# Patient Record
Sex: Female | Born: 1940
Health system: Southern US, Community
[De-identification: ages and names within clinical notes are randomized; demographics above are authoritative.]

## PROBLEM LIST (undated history)

## (undated) DIAGNOSIS — M1990 Unspecified osteoarthritis, unspecified site: Secondary | ICD-10-CM

## (undated) DIAGNOSIS — K5790 Diverticulosis of intestine, part unspecified, without perforation or abscess without bleeding: Secondary | ICD-10-CM

## (undated) DIAGNOSIS — Z8719 Personal history of other diseases of the digestive system: Secondary | ICD-10-CM

## (undated) DIAGNOSIS — K8021 Calculus of gallbladder without cholecystitis with obstruction: Secondary | ICD-10-CM

## (undated) DIAGNOSIS — F419 Anxiety disorder, unspecified: Secondary | ICD-10-CM

## (undated) DIAGNOSIS — E039 Hypothyroidism, unspecified: Secondary | ICD-10-CM

## (undated) HISTORY — PX: ABDOMINAL HYSTERECTOMY: SHX81

## (undated) HISTORY — PX: THYROIDECTOMY: SHX17

## (undated) HISTORY — PX: APPENDECTOMY: SHX54

## (undated) HISTORY — PX: COLONOSCOPY: SHX174

## (undated) HISTORY — PX: FRACTURE SURGERY: SHX138

## (undated) HISTORY — DX: Diverticulosis of intestine, part unspecified, without perforation or abscess without bleeding: K57.90

## (undated) HISTORY — PX: DILATION AND CURETTAGE OF UTERUS: SHX78

## (undated) HISTORY — PX: TONSILLECTOMY: SUR1361

---

## 1997-07-10 ENCOUNTER — Encounter: Admission: RE | Admit: 1997-07-10 | Discharge: 1997-10-08 | Payer: Self-pay | Admitting: Neurosurgery

## 1998-11-20 ENCOUNTER — Other Ambulatory Visit: Admission: RE | Admit: 1998-11-20 | Discharge: 1998-11-20 | Payer: Self-pay | Admitting: Obstetrics and Gynecology

## 2003-05-09 ENCOUNTER — Encounter: Admission: RE | Admit: 2003-05-09 | Discharge: 2003-05-09 | Payer: Self-pay | Admitting: Obstetrics and Gynecology

## 2004-06-30 ENCOUNTER — Ambulatory Visit: Payer: Self-pay | Admitting: Gastroenterology

## 2004-07-12 ENCOUNTER — Ambulatory Visit: Payer: Self-pay | Admitting: Gastroenterology

## 2006-04-10 ENCOUNTER — Encounter: Admission: RE | Admit: 2006-04-10 | Discharge: 2006-04-10 | Payer: Self-pay | Admitting: Specialist

## 2007-04-12 HISTORY — PX: PLANTAR FASCIA RELEASE: SHX2239

## 2007-07-12 ENCOUNTER — Other Ambulatory Visit: Admission: RE | Admit: 2007-07-12 | Discharge: 2007-07-12 | Payer: Self-pay | Admitting: Obstetrics and Gynecology

## 2007-12-04 ENCOUNTER — Ambulatory Visit (HOSPITAL_BASED_OUTPATIENT_CLINIC_OR_DEPARTMENT_OTHER): Admission: RE | Admit: 2007-12-04 | Discharge: 2007-12-04 | Payer: Self-pay | Admitting: Orthopedic Surgery

## 2008-12-24 ENCOUNTER — Observation Stay (HOSPITAL_COMMUNITY): Admission: EM | Admit: 2008-12-24 | Discharge: 2008-12-25 | Payer: Self-pay | Admitting: Emergency Medicine

## 2009-07-08 ENCOUNTER — Emergency Department (HOSPITAL_COMMUNITY): Admission: EM | Admit: 2009-07-08 | Discharge: 2009-07-08 | Payer: Self-pay | Admitting: Emergency Medicine

## 2010-07-16 LAB — CK TOTAL AND CKMB (NOT AT ARMC): CK, MB: 1.6 ng/mL (ref 0.3–4.0)

## 2010-07-16 LAB — LIPID PANEL
HDL: 49 mg/dL (ref >39)
Total CHOL/HDL Ratio: 3.8 RATIO
VLDL: 23 mg/dL (ref 0–40)

## 2010-07-16 LAB — COMPREHENSIVE METABOLIC PANEL
ALT: 36 U/L — ABNORMAL HIGH (ref 0–35)
CO2: 28 mEq/L (ref 19–32)
Calcium: 9.6 mg/dL (ref 8.4–10.5)
Chloride: 104 mEq/L (ref 96–112)
Creatinine, Ser: 0.83 mg/dL (ref 0.4–1.2)
GFR calc non Af Amer: >60 mL/min (ref >60)
Glucose, Bld: 90 mg/dL (ref 70–99)
Sodium: 139 mEq/L (ref 135–145)
Total Bilirubin: 1 mg/dL (ref 0.3–1.2)

## 2010-07-16 LAB — CBC
Hemoglobin: 15.1 g/dL — ABNORMAL HIGH (ref 12.0–15.0)
MCHC: 34.3 g/dL (ref 30.0–36.0)
MCV: 92.8 fL (ref 78.0–100.0)
RBC: 4.76 MIL/uL (ref 3.87–5.11)
WBC: 8.8 10*3/uL (ref 4.0–10.5)

## 2010-07-16 LAB — CARDIAC PANEL(CRET KIN+CKTOT+MB+TROPI)
Relative Index: INVALID (ref 0.0–2.5)
Total CK: 85 U/L (ref 7–177)

## 2010-07-16 LAB — TROPONIN I: Troponin I: 0.01 ng/mL (ref 0.00–0.06)

## 2010-07-16 LAB — DIFFERENTIAL
Basophils Absolute: 0.1 10*3/uL (ref 0.0–0.1)
Eosinophils Absolute: 0 10*3/uL (ref 0.0–0.7)
Eosinophils Relative: 0 % (ref 0–5)
Lymphs Abs: 2.1 10*3/uL (ref 0.7–4.0)
Neutrophils Relative %: 71 % (ref 43–77)

## 2010-07-16 LAB — POCT CARDIAC MARKERS
CKMB, poc: <1 ng/mL — ABNORMAL LOW (ref 1.0–8.0)
Myoglobin, poc: 118 ng/mL (ref 12–200)
Troponin i, poc: <0.05 ng/mL (ref 0.00–0.09)
Troponin i, poc: <0.05 ng/mL (ref 0.00–0.09)

## 2010-07-16 LAB — LIPASE, BLOOD: Lipase: 17 U/L (ref 11–59)

## 2010-08-24 NOTE — Op Note (Signed)
Lori Webb, Lori Webb              ACCOUNT NO.:  [id number]   MEDICAL RECORD NO.:  [id number]          PATIENT TYPE:  AMB   LOCATION:  [facility]                         FACILITY:  [facility]   PHYSICIAN:  Marlowe Kays, M.D.  DATE OF BIRTH:  02-09-1941   DATE OF PROCEDURE:  12/04/2007  DATE OF DISCHARGE:                               OPERATIVE REPORT   PREOPERATIVE DIAGNOSIS:  Chronic plantar fasciitis, left heel.   POSTOPERATIVE DIAGNOSIS:  Chronic plantar fasciitis, left heel.   OPERATION:  Release of plantar fascia, left heel.   ASSISTANT:  Nurse.   ANESTHESIA:  General.   PATHOLOGY AND INDICATION FOR PROCEDURE:  Chronic pain and tenderness of  the medial origin of the plantar fascia, left heel.  This has proved  refractory to nonsurgical treatment modalities.  She said it has been  present for about 7 years.  See operative description below for  additional details of her pathology.   PROCEDURE:  Under satisfactory general anesthesia, pneumatic tourniquet  with the left leg Esmarch'd out non-sterilely, and the leg prepped with  DuraPrep from midcalf to toes and draped in a sterile field, time-out  performed.  I made a 2-inch incision parallel to the floor at the  posterior longitudinal arch of the foot and adjacent to os calcis.  The  incision was carried down through the subcutaneous tissue to expose the  plantar fascia.  I protected it superiorly from the overlying muscle and  inferiorly from the soft tissue and then began progressive release  across the width of the heel with small scissors.  The fascia was quite  thick and fibrotic, consistent with her long-term problem.  When she had  had a thorough release as felt by a Therapist, nutritional, I irrigated the  wound well and injected the soft tissues with 0.5% plain Marcaine.  I  reapproximated the subcutaneous tissue with interrupted 3-0 Vicryl and  the skin with interrupted 4-0 nylon mass sutures.  Betadine and Adaptic  dressings  were applied.  Tourniquet was released.  She tolerated the  procedure well and was taken to recovery in satisfactory condition with  no known complications.           ______________________________  Marlowe Kays, M.D.     JA/MEDQ  D:  12/04/2007  T:  12/04/2007  Job:  [id number]

## 2011-08-16 DIAGNOSIS — Z1231 Encounter for screening mammogram for malignant neoplasm of breast: Secondary | ICD-10-CM | POA: Diagnosis not present

## 2011-08-26 DIAGNOSIS — L255 Unspecified contact dermatitis due to plants, except food: Secondary | ICD-10-CM | POA: Diagnosis not present

## 2011-08-26 DIAGNOSIS — E039 Hypothyroidism, unspecified: Secondary | ICD-10-CM | POA: Diagnosis not present

## 2011-11-01 DIAGNOSIS — M799 Soft tissue disorder, unspecified: Secondary | ICD-10-CM | POA: Diagnosis not present

## 2011-11-02 ENCOUNTER — Other Ambulatory Visit: Payer: Self-pay | Admitting: Family Medicine

## 2011-11-07 ENCOUNTER — Ambulatory Visit
Admission: RE | Admit: 2011-11-07 | Discharge: 2011-11-07 | Disposition: A | Discharge status: Home / Self Care | Payer: Medicare Other | Source: Ambulatory Visit | Attending: Family Medicine | Admitting: Family Medicine

## 2011-11-07 DIAGNOSIS — M799 Soft tissue disorder, unspecified: Secondary | ICD-10-CM | POA: Diagnosis not present

## 2011-12-24 DIAGNOSIS — Z23 Encounter for immunization: Secondary | ICD-10-CM | POA: Diagnosis not present

## 2011-12-25 ENCOUNTER — Emergency Department (HOSPITAL_COMMUNITY): Admission: EM | Admit: 2011-12-25 | Payer: Medicare Other | Source: Home / Self Care

## 2012-01-03 ENCOUNTER — Encounter (INDEPENDENT_AMBULATORY_CARE_PROVIDER_SITE_OTHER): Payer: Self-pay | Admitting: General Surgery

## 2012-01-16 ENCOUNTER — Ambulatory Visit (INDEPENDENT_AMBULATORY_CARE_PROVIDER_SITE_OTHER): Payer: Self-pay | Admitting: General Surgery

## 2012-02-01 DIAGNOSIS — L089 Local infection of the skin and subcutaneous tissue, unspecified: Secondary | ICD-10-CM | POA: Diagnosis not present

## 2012-04-17 ENCOUNTER — Encounter (INDEPENDENT_AMBULATORY_CARE_PROVIDER_SITE_OTHER): Payer: Self-pay | Admitting: Surgery

## 2012-04-17 ENCOUNTER — Ambulatory Visit (INDEPENDENT_AMBULATORY_CARE_PROVIDER_SITE_OTHER): Payer: Medicare Other | Admitting: General Surgery

## 2012-04-17 ENCOUNTER — Ambulatory Visit (INDEPENDENT_AMBULATORY_CARE_PROVIDER_SITE_OTHER): Payer: Medicare Other | Admitting: Surgery

## 2012-04-17 VITALS — BP 112/64 | HR 64 | Temp 97.2°F | Resp 16 | Ht 62.75 in | Wt 144.6 lb

## 2012-04-17 DIAGNOSIS — L089 Local infection of the skin and subcutaneous tissue, unspecified: Secondary | ICD-10-CM | POA: Insufficient documentation

## 2012-04-17 DIAGNOSIS — L723 Sebaceous cyst: Secondary | ICD-10-CM | POA: Diagnosis not present

## 2012-04-17 NOTE — Progress Notes (Signed)
Patient ID: Lori Webb, female   DOB: 1940/04/23, 72 y.o.   MRN: [id number]  Chief Complaint  Patient presents with  . Other    seb cyst    HPI Lori Webb is a 72 y.o. female.   HPI This is a 72 year old female who presents with a painful cyst on her right posterior upper back. She reports that it has been present for at least a year but is now being larger and causing increased pain. She has noticed some mild erythema at times but has not had drainage. She has had a previous severely infected sebaceous cyst on her abdominal wall in the past. Past Medical History  Diagnosis Date  . Thyroid disease   . Hyperlipidemia   . Diverticulosis     Past Surgical History  Procedure Date  . Cesarean section     x2  . Thyroidectomy     Family History  Problem Relation Age of Onset  . Cancer Mother     breast  . Cancer Maternal Grandmother     breast    Social History History  Substance Use Topics  . Smoking status: Never Smoker   . Smokeless tobacco: Not on file  . Alcohol Use: 0.6 oz/week    1 Glasses of wine per week     Comment: 1 glass of wine per week    No Known Allergies  Current Outpatient Prescriptions  Medication Sig Dispense Refill  . levothyroxine (SYNTHROID, LEVOTHROID) 100 MCG tablet Take 100 mcg by mouth daily.      . cetirizine (ZYRTEC) 10 MG tablet Take 10 mg by mouth daily.        Review of Systems Review of Systems  All other systems reviewed and are negative.    Blood pressure 112/64, pulse 64, temperature 97.2 F (36.2 C), temperature source Temporal, resp. rate 16, height 5' 2.75" (1.594 m), weight 144 lb 9.6 oz (65.59 kg).  Physical Exam Physical Exam  Constitutional: She is oriented to person, place, and time. She appears well-developed and well-nourished. No distress.  HENT:  Head: Normocephalic and atraumatic.  Right Ear: External ear normal.  Left Ear: External ear normal.  Eyes: Conjunctivae normal are normal. Pupils are  equal, round, and reactive to light.  Neck: Normal range of motion. Neck supple. No tracheal deviation present.  Cardiovascular: Normal rate, regular rhythm, normal heart sounds and intact distal pulses.   No murmur heard. Pulmonary/Chest: Effort normal and breath sounds normal. No respiratory distress.  Abdominal: Soft.  Musculoskeletal: Normal range of motion.  Neurological: She is alert and oriented to person, place, and time.  Skin: Skin is warm and dry. She is not diaphoretic.       There is a 1 cm cyst on the right upper back which is moderately tender and shows very mild erythema.  Psychiatric: Her behavior is normal. Judgment normal.    Data Reviewed   Assessment    Chronic sebaceous cyst Right upper back    Plan    Given her discomfort and her history of previous infected sebaceous cyst, removal of this in the operating room is recommended.  I discussed the risks which includes but is not limited to bleeding, infection, having an open wound, recurrence, etc. She understands and wishes to proceed. Likelihood of success is good       Marrisa Kimber A 04/17/2012, 9:13 AM

## 2012-04-27 ENCOUNTER — Encounter (HOSPITAL_BASED_OUTPATIENT_CLINIC_OR_DEPARTMENT_OTHER): Payer: Self-pay | Admitting: *Deleted

## 2012-05-01 NOTE — H&P (Signed)
Patient ID: Lori Webb, female DOB: 1940/07/26, 72 y.o. MRN: [id number]  Chief Complaint   Patient presents with   .  Other     seb cyst    HPI  Lori Webb is a 72 y.o. female.  HPI  This is a 72 year old female who presents with a painful cyst on her right posterior upper back. She reports that it has been present for at least a year but is now being larger and causing increased pain. She has noticed some mild erythema at times but has not had drainage. She has had a previous severely infected sebaceous cyst on her abdominal wall in the past.  Past Medical History   Diagnosis  Date   .  Thyroid disease    .  Hyperlipidemia    .  Diverticulosis     Past Surgical History   Procedure  Date   .  Cesarean section      x2   .  Thyroidectomy     Family History   Problem  Relation  Age of Onset   .  Cancer  Mother       breast    .  Cancer  Maternal Grandmother       breast    Social History  History   Substance Use Topics   .  Smoking status:  Never Smoker   .  Smokeless tobacco:  Not on file   .  Alcohol Use:  0.6 oz/week     1 Glasses of wine per week      Comment: 1 glass of wine per week    No Known Allergies  Current Outpatient Prescriptions   Medication  Sig  Dispense  Refill   .  levothyroxine (SYNTHROID, LEVOTHROID) 100 MCG tablet  Take 100 mcg by mouth daily.     .  cetirizine (ZYRTEC) 10 MG tablet  Take 10 mg by mouth daily.      Review of Systems  Review of Systems  All other systems reviewed and are negative.   Blood pressure 112/64, pulse 64, temperature 97.2 F (36.2 C), temperature source Temporal, resp. rate 16, height 5' 2.75" (1.594 m), weight 144 lb 9.6 oz (65.59 kg).  Physical Exam  Physical Exam  Constitutional: She is oriented to person, place, and time. She appears well-developed and well-nourished. No distress.  HENT:  Head: Normocephalic and atraumatic.  Right Ear: External ear normal.  Left Ear: External ear normal.  Eyes:  Conjunctivae normal are normal. Pupils are equal, round, and reactive to light.  Neck: Normal range of motion. Neck supple. No tracheal deviation present.  Cardiovascular: Normal rate, regular rhythm, normal heart sounds and intact distal pulses.  No murmur heard.  Pulmonary/Chest: Effort normal and breath sounds normal. No respiratory distress.  Abdominal: Soft.  Musculoskeletal: Normal range of motion.  Neurological: She is alert and oriented to person, place, and time.  Skin: Skin is warm and dry. She is not diaphoretic.  There is a 1 cm cyst on the right upper back which is moderately tender and shows very mild erythema.  Psychiatric: Her behavior is normal. Judgment normal.   Data Reviewed  Assessment   Chronic sebaceous cyst Right upper back   Plan   Given her discomfort and her history of previous infected sebaceous cyst, removal of this in the operating room is recommended. I discussed the risks which includes but is not limited to bleeding, infection, having an open wound, recurrence, etc. She understands and  wishes to proceed. Likelihood of success is good

## 2012-05-02 ENCOUNTER — Encounter (HOSPITAL_BASED_OUTPATIENT_CLINIC_OR_DEPARTMENT_OTHER): Payer: Self-pay | Admitting: *Deleted

## 2012-05-02 ENCOUNTER — Ambulatory Visit (HOSPITAL_BASED_OUTPATIENT_CLINIC_OR_DEPARTMENT_OTHER)
Admission: RE | Admit: 2012-05-02 | Discharge: 2012-05-02 | Disposition: A | Discharge status: Home / Self Care | Payer: Medicare Other | Source: Ambulatory Visit | Attending: Surgery | Admitting: Surgery

## 2012-05-02 ENCOUNTER — Encounter (HOSPITAL_BASED_OUTPATIENT_CLINIC_OR_DEPARTMENT_OTHER)
Admission: RE | Disposition: A | Discharge status: Home / Self Care | Payer: Self-pay | Source: Ambulatory Visit | Attending: Surgery

## 2012-05-02 ENCOUNTER — Ambulatory Visit (HOSPITAL_BASED_OUTPATIENT_CLINIC_OR_DEPARTMENT_OTHER): Payer: Medicare Other | Admitting: *Deleted

## 2012-05-02 DIAGNOSIS — Z79899 Other long term (current) drug therapy: Secondary | ICD-10-CM | POA: Diagnosis not present

## 2012-05-02 DIAGNOSIS — E785 Hyperlipidemia, unspecified: Secondary | ICD-10-CM | POA: Diagnosis not present

## 2012-05-02 DIAGNOSIS — L723 Sebaceous cyst: Secondary | ICD-10-CM | POA: Insufficient documentation

## 2012-05-02 HISTORY — PX: MASS EXCISION: SHX2000

## 2012-05-02 HISTORY — DX: Anxiety disorder, unspecified: F41.9

## 2012-05-02 SURGERY — EXCISION MASS
Anesthesia: Monitor Anesthesia Care | Site: Back | Laterality: Right | Wound class: Clean

## 2012-05-02 MED ORDER — SODIUM CHLORIDE 0.9 % IJ SOLN
3.0000 mL | Freq: Two times a day (BID) | INTRAMUSCULAR | Status: DC
Start: 2012-05-02 — End: 2012-05-02

## 2012-05-02 MED ORDER — MORPHINE SULFATE 2 MG/ML IJ SOLN: 2.0000 mg | INTRAMUSCULAR | Status: DC | PRN

## 2012-05-02 MED ORDER — LIDOCAINE HCL (CARDIAC) 20 MG/ML IV SOLN
INTRAVENOUS | Status: DC | PRN
  Administered 2012-05-02: 20 mg via INTRAVENOUS

## 2012-05-02 MED ORDER — OXYCODONE HCL 5 MG PO TABS: 5.0000 mg | ORAL_TABLET | ORAL | Status: DC | PRN

## 2012-05-02 MED ORDER — MIDAZOLAM HCL 5 MG/5ML IJ SOLN
INTRAMUSCULAR | Status: DC | PRN
  Administered 2012-05-02: 1 mg via INTRAVENOUS

## 2012-05-02 MED ORDER — FENTANYL CITRATE 0.05 MG/ML IJ SOLN
INTRAMUSCULAR | Status: DC | PRN
  Administered 2012-05-02: 25 ug via INTRAVENOUS
  Administered 2012-05-02: 50 ug via INTRAVENOUS
  Administered 2012-05-02: 25 ug via INTRAVENOUS

## 2012-05-02 MED ORDER — ONDANSETRON HCL 4 MG/2ML IJ SOLN
INTRAMUSCULAR | Status: DC | PRN
  Administered 2012-05-02: 4 mg via INTRAVENOUS

## 2012-05-02 MED ORDER — ACETAMINOPHEN 325 MG PO TABS: 650.0000 mg | ORAL_TABLET | ORAL | Status: DC | PRN

## 2012-05-02 MED ORDER — CEFAZOLIN SODIUM-DEXTROSE 2-3 GM-% IV SOLR
2.0000 g | INTRAVENOUS | Status: AC
  Administered 2012-05-02: 2 g via INTRAVENOUS

## 2012-05-02 MED ORDER — LACTATED RINGERS IV SOLN
INTRAVENOUS | Status: DC
  Administered 2012-05-02: 11:00:00 via INTRAVENOUS

## 2012-05-02 MED ORDER — HYDROCODONE-ACETAMINOPHEN 5-325 MG PO TABS: 1.0000 | ORAL_TABLET | ORAL | Status: AC | PRN

## 2012-05-02 MED ORDER — BUPIVACAINE-EPINEPHRINE 0.5% -1:200000 IJ SOLN
INTRAMUSCULAR | Status: DC | PRN
  Administered 2012-05-02: 8 mL

## 2012-05-02 MED ORDER — ONDANSETRON HCL 4 MG/2ML IJ SOLN: 4.0000 mg | Freq: Four times a day (QID) | INTRAMUSCULAR | Status: DC | PRN

## 2012-05-02 MED ORDER — SODIUM CHLORIDE 0.9 % IJ SOLN: 3.0000 mL | INTRAMUSCULAR | Status: DC | PRN

## 2012-05-02 MED ORDER — ACETAMINOPHEN 650 MG RE SUPP: 650.0000 mg | RECTAL | Status: DC | PRN

## 2012-05-02 MED ORDER — SODIUM CHLORIDE 0.9 % IV SOLN: 250.0000 mL | INTRAVENOUS | Status: DC | PRN

## 2012-05-02 MED ORDER — HYDROCODONE-ACETAMINOPHEN 5-325 MG PO TABS: 1.0000 | ORAL_TABLET | ORAL | Status: DC | PRN

## 2012-05-02 MED ORDER — PROPOFOL 10 MG/ML IV EMUL
INTRAVENOUS | Status: DC | PRN
  Administered 2012-05-02: 100 ug/kg/min via INTRAVENOUS

## 2012-05-02 SURGICAL SUPPLY — 41 items
BENZOIN TINCTURE PRP APPL 2/3 (GAUZE/BANDAGES/DRESSINGS) ×2 IMPLANT
BLADE HEX COATED 2.75 (ELECTRODE) IMPLANT
BLADE SURG 15 STRL LF DISP TIS (BLADE) ×1 IMPLANT
BLADE SURG 15 STRL SS (BLADE) ×1
BLADE SURG ROTATE 9660 (MISCELLANEOUS) IMPLANT
CANISTER SUCTION 1200CC (MISCELLANEOUS) IMPLANT
CHLORAPREP W/TINT 26ML (MISCELLANEOUS) ×2 IMPLANT
CLOTH BEACON ORANGE TIMEOUT ST (SAFETY) ×2 IMPLANT
COVER MAYO STAND STRL (DRAPES) ×2 IMPLANT
COVER TABLE BACK 60X90 (DRAPES) ×2 IMPLANT
DECANTER SPIKE VIAL GLASS SM (MISCELLANEOUS) ×2 IMPLANT
DRAPE PED LAPAROTOMY (DRAPES) ×2 IMPLANT
DRAPE UTILITY XL STRL (DRAPES) ×2 IMPLANT
DRSG TEGADERM 4X4.75 (GAUZE/BANDAGES/DRESSINGS) ×2 IMPLANT
ELECT REM PT RETURN 9FT ADLT (ELECTROSURGICAL) ×2
ELECTRODE REM PT RTRN 9FT ADLT (ELECTROSURGICAL) ×1 IMPLANT
GAUZE SPONGE 4X4 12PLY STRL LF (GAUZE/BANDAGES/DRESSINGS) ×2 IMPLANT
GLOVE INDICATOR 7.0 STRL GRN (GLOVE) ×2 IMPLANT
GLOVE SURG SIGNA 7.5 PF LTX (GLOVE) ×2 IMPLANT
GLOVE SURG SS PI 7.0 STRL IVOR (GLOVE) ×2 IMPLANT
GOWN PREVENTION PLUS XLARGE (GOWN DISPOSABLE) ×2 IMPLANT
GOWN PREVENTION PLUS XXLARGE (GOWN DISPOSABLE) ×2 IMPLANT
NEEDLE HYPO 25X1 1.5 SAFETY (NEEDLE) ×2 IMPLANT
NS IRRIG 1000ML POUR BTL (IV SOLUTION) IMPLANT
PACK BASIN DAY SURGERY FS (CUSTOM PROCEDURE TRAY) ×2 IMPLANT
PENCIL BUTTON HOLSTER BLD 10FT (ELECTRODE) ×2 IMPLANT
SLEEVE SCD COMPRESS KNEE MED (MISCELLANEOUS) ×2 IMPLANT
SPONGE LAP 4X18 X RAY DECT (DISPOSABLE) IMPLANT
STRIP CLOSURE SKIN 1/2X4 (GAUZE/BANDAGES/DRESSINGS) ×2 IMPLANT
SUT MNCRL AB 4-0 PS2 18 (SUTURE) ×2 IMPLANT
SUT VIC AB 2-0 SH 27 (SUTURE)
SUT VIC AB 2-0 SH 27XBRD (SUTURE) IMPLANT
SUT VIC AB 3-0 SH 27 (SUTURE) ×1
SUT VIC AB 3-0 SH 27X BRD (SUTURE) ×1 IMPLANT
SYR BULB 3OZ (MISCELLANEOUS) IMPLANT
SYR CONTROL 10ML LL (SYRINGE) ×2 IMPLANT
TOWEL OR 17X24 6PK STRL BLUE (TOWEL DISPOSABLE) ×2 IMPLANT
TOWEL OR NON WOVEN STRL DISP B (DISPOSABLE) ×2 IMPLANT
TUBE CONNECTING 20X1/4 (TUBING) IMPLANT
WATER STERILE IRR 1000ML POUR (IV SOLUTION) ×2 IMPLANT
YANKAUER SUCT BULB TIP NO VENT (SUCTIONS) IMPLANT

## 2012-05-02 NOTE — Anesthesia Preprocedure Evaluation (Addendum)
Anesthesia Evaluation Anesthesia Physical Anesthesia Plan  ASA: II  Anesthesia Plan:    Post-op Pain Management:    Induction:   Airway Management Planned:   Additional Equipment:   Intra-op Plan:   Post-operative Plan:   Informed Consent:   Plan Discussed with:   Anesthesia Plan Comments:         Anesthesia Quick Evaluation  

## 2012-05-02 NOTE — Op Note (Signed)
EXCISION MASS  Procedure Note  Lori Webb 05/02/2012   Pre-op Diagnosis: right upper back skin cyst     Post-op Diagnosis: same  Procedure(s): EXCISION SEBACEOUS CYST RIGHT UPPER BACK   Surgeon(s): Shelly Rubenstein, MD  Anesthesia: Monitor Anesthesia Care  Staff:  Alphonzo Lemmings, RN - Circulator Flor M Neiers, CST - Scrub Person  Estimated Blood Loss: Minimal               Specimens: sent to path          [facility] A   Date: 05/02/2012  Time: 11:51 AM

## 2012-05-02 NOTE — Anesthesia Postprocedure Evaluation (Signed)
Anesthesia Post Note  Patient: Lori Webb  Procedure(s) Performed: Procedure(s) (LRB): EXCISION MASS (Right)  Anesthesia type: general  Patient location: PACU  Post pain: Pain level controlled  Post assessment: Patient's Cardiovascular Status Stable  Last Vitals:  Filed Vitals:   05/02/12 1151  BP: 109/49  Pulse:   Temp: 37 C  Resp: 10    Post vital signs: Reviewed and stable  Level of consciousness: sedated  Complications: No apparent anesthesia complications

## 2012-05-02 NOTE — Anesthesia Procedure Notes (Addendum)
Procedure Name: MAC Date/Time: 05/02/2012 11:26 AM Performed by: Meyer Russel Pre-anesthesia Checklist: Patient identified, Emergency Drugs available, Suction available and Patient being monitored Patient Re-evaluated:Patient Re-evaluated prior to inductionOxygen Delivery Method: Simple face mask Preoxygenation: Pre-oxygenation with 100% oxygen

## 2012-05-02 NOTE — Interval H&P Note (Signed)
History and Physical Interval Note: no change in H and P  05/02/2012 11:10 AM  Lori Webb  has presented today for surgery, with the diagnosis of right upper back skin cyst  The various methods of treatment have been discussed with the patient and family. After consideration of risks, benefits and other options for treatment, the patient has consented to  Procedure(s) (LRB) with comments: EXCISION MASS (Right) - excision cyst right upper back as a surgical intervention .  The patient's history has been reviewed, patient examined, no change in status, stable for surgery.  I have reviewed the patient's chart and labs.  Questions were answered to the patient's satisfaction.     [name] A

## 2012-05-02 NOTE — Transfer of Care (Signed)
Immediate Anesthesia Transfer of Care Note  Patient: Lori Webb  Procedure(s) Performed: Procedure(s) (LRB) with comments: EXCISION MASS (Right) - excision cyst right upper back  Patient Location: PACU  Anesthesia Type:MAC  Level of Consciousness: awake, alert  and oriented  Airway & Oxygen Therapy: Patient Spontanous Breathing, SaO2 100% on RA  Post-op Assessment:  Report given to PACU RN, Post -op Vital signs reviewed and stable and Patient moving all extremities  Post vital signs: Reviewed and stable  Complications: No apparent anesthesia complications

## 2012-05-03 ENCOUNTER — Encounter (HOSPITAL_BASED_OUTPATIENT_CLINIC_OR_DEPARTMENT_OTHER): Payer: Self-pay | Admitting: Surgery

## 2012-05-03 NOTE — Op Note (Signed)
NAMEBENJAMIN, Lori Webb NO.:  [id number]  MEDICAL RECORD NO.:  [id number]  LOCATION:  [facility]                         FACILITY:  [facility]  PHYSICIAN:  Abigail Miyamoto, M.D. DATE OF BIRTH:  07/10/1940  DATE OF PROCEDURE:  05/02/2012 DATE OF DISCHARGE:                              OPERATIVE REPORT   PREOPERATIVE DIAGNOSIS:  Sebaceous cyst, right upper back.  POSTOPERATIVE DIAGNOSIS:  Sebaceous cyst, right upper back.  PROCEDURE:  Excision of sebaceous cyst, right upper back.  SURGEON:  Abigail Miyamoto, M.D.  ANESTHESIA:  0.25% Marcaine and monitored anesthesia care.  ESTIMATED BLOOD LOSS:  Minimal.  INDICATIONS:  This is a 72 year old female, who has had a chronic sebaceous cyst, which had been previously infected on her right upper back.  Because of persistent pain, increase in size, and recurrent infections, decision was made to proceed with removal of the cyst.  PROCEDURE IN DETAIL:  The patient was brought to the operating room, identified as Lori Webb.  She was placed supine on the operating table.  General anesthesia was induced.  The patient was placed in the left lateral decubitus position.  Her right upper back were then prepped and draped in the usual sterile fashion.  I anesthetized the skin overlying the cyst with Marcaine.  I made a small longitudinal incision over the cyst with a scalpel and taken down into the subcutaneous tissue with the cautery.  I then was able to remove the cyst and cyst contents including the entire capsule from the subcutaneous tissue with the electrocautery.  The cyst was sent to pathology for evaluation. Hemostasis was achieved with cautery.  I anesthetized the wound further with Marcaine.  I then closed the subcutaneous tissue with interrupted 3- 0 Vicryl sutures and closed the skin with a running 4-0 Monocryl.  Steri- Strips, gauze, and Tegaderm were then applied.  The patient tolerated the procedure well.  All the  counts were correct at the end of the procedure.  The patient was then taken in a stable condition from the operating room to the recovery room.     Abigail Miyamoto, M.D.     DB/MEDQ  D:  05/02/2012  T:  05/03/2012  Job:  [id number]

## 2012-05-22 ENCOUNTER — Encounter (INDEPENDENT_AMBULATORY_CARE_PROVIDER_SITE_OTHER): Payer: Medicare Other | Admitting: Surgery

## 2012-05-24 ENCOUNTER — Encounter (INDEPENDENT_AMBULATORY_CARE_PROVIDER_SITE_OTHER): Payer: Medicare Other | Admitting: Surgery

## 2012-06-12 DIAGNOSIS — H5200 Hypermetropia, unspecified eye: Secondary | ICD-10-CM | POA: Diagnosis not present

## 2012-06-12 DIAGNOSIS — H52229 Regular astigmatism, unspecified eye: Secondary | ICD-10-CM | POA: Diagnosis not present

## 2012-06-12 DIAGNOSIS — H04129 Dry eye syndrome of unspecified lacrimal gland: Secondary | ICD-10-CM | POA: Diagnosis not present

## 2012-06-12 DIAGNOSIS — H524 Presbyopia: Secondary | ICD-10-CM | POA: Diagnosis not present

## 2012-11-20 DIAGNOSIS — N12 Tubulo-interstitial nephritis, not specified as acute or chronic: Secondary | ICD-10-CM | POA: Diagnosis not present

## 2012-11-20 DIAGNOSIS — E039 Hypothyroidism, unspecified: Secondary | ICD-10-CM | POA: Diagnosis not present

## 2012-11-20 DIAGNOSIS — R319 Hematuria, unspecified: Secondary | ICD-10-CM | POA: Diagnosis not present

## 2012-11-21 ENCOUNTER — Encounter (HOSPITAL_BASED_OUTPATIENT_CLINIC_OR_DEPARTMENT_OTHER): Payer: Self-pay | Admitting: *Deleted

## 2012-11-21 ENCOUNTER — Emergency Department (HOSPITAL_BASED_OUTPATIENT_CLINIC_OR_DEPARTMENT_OTHER): Payer: Medicare Other

## 2012-11-21 ENCOUNTER — Emergency Department (HOSPITAL_BASED_OUTPATIENT_CLINIC_OR_DEPARTMENT_OTHER)
Admission: EM | Admit: 2012-11-21 | Discharge: 2012-11-22 | Disposition: A | Discharge status: Home / Self Care | Payer: Medicare Other | Attending: Emergency Medicine | Admitting: Emergency Medicine

## 2012-11-21 DIAGNOSIS — K5732 Diverticulitis of large intestine without perforation or abscess without bleeding: Secondary | ICD-10-CM | POA: Diagnosis not present

## 2012-11-21 DIAGNOSIS — R509 Fever, unspecified: Secondary | ICD-10-CM | POA: Diagnosis not present

## 2012-11-21 DIAGNOSIS — F411 Generalized anxiety disorder: Secondary | ICD-10-CM | POA: Insufficient documentation

## 2012-11-21 DIAGNOSIS — K5792 Diverticulitis of intestine, part unspecified, without perforation or abscess without bleeding: Secondary | ICD-10-CM

## 2012-11-21 DIAGNOSIS — Z79899 Other long term (current) drug therapy: Secondary | ICD-10-CM | POA: Insufficient documentation

## 2012-11-21 DIAGNOSIS — R110 Nausea: Secondary | ICD-10-CM | POA: Diagnosis not present

## 2012-11-21 DIAGNOSIS — N281 Cyst of kidney, acquired: Secondary | ICD-10-CM | POA: Diagnosis not present

## 2012-11-21 DIAGNOSIS — K8020 Calculus of gallbladder without cholecystitis without obstruction: Secondary | ICD-10-CM | POA: Diagnosis not present

## 2012-11-21 LAB — COMPREHENSIVE METABOLIC PANEL
ALT: 40 U/L — ABNORMAL HIGH (ref 0–35)
AST: 37 U/L (ref 0–37)
Albumin: 3.5 g/dL (ref 3.5–5.2)
Calcium: 9.9 mg/dL (ref 8.4–10.5)
GFR calc Af Amer: 72 mL/min — ABNORMAL LOW (ref >90)
Glucose, Bld: 121 mg/dL — ABNORMAL HIGH (ref 70–99)
Sodium: 140 mEq/L (ref 135–145)
Total Protein: 7 g/dL (ref 6.0–8.3)

## 2012-11-21 LAB — CBC WITH DIFFERENTIAL/PLATELET
Basophils Absolute: 0 10*3/uL (ref 0.0–0.1)
Basophils Relative: 0 % (ref 0–1)
Eosinophils Absolute: 0.2 10*3/uL (ref 0.0–0.7)
Eosinophils Relative: 2 % (ref 0–5)
MCH: 31.4 pg (ref 26.0–34.0)
MCHC: 32.9 g/dL (ref 30.0–36.0)
MCV: 95.5 fL (ref 78.0–100.0)
Platelets: 266 10*3/uL (ref 150–400)
RDW: 12.4 % (ref 11.5–15.5)

## 2012-11-21 LAB — URINALYSIS, ROUTINE W REFLEX MICROSCOPIC
Protein, ur: NEGATIVE mg/dL
Urobilinogen, UA: 1 mg/dL (ref 0.0–1.0)

## 2012-11-21 LAB — URINE MICROSCOPIC-ADD ON

## 2012-11-21 LAB — CG4 I-STAT (LACTIC ACID): Lactic Acid, Venous: 0.72 mmol/L (ref 0.5–2.2)

## 2012-11-21 MED ORDER — IOHEXOL 300 MG/ML  SOLN
100.0000 mL | Freq: Once | INTRAMUSCULAR | Status: AC | PRN
  Administered 2012-11-21: 100 mL via INTRAVENOUS

## 2012-11-21 MED ORDER — ONDANSETRON HCL 4 MG PO TABS: 4.0000 mg | ORAL_TABLET | Freq: Four times a day (QID) | ORAL | Status: DC

## 2012-11-21 MED ORDER — MORPHINE SULFATE 2 MG/ML IJ SOLN
2.0000 mg | Freq: Once | INTRAMUSCULAR | Status: AC
  Administered 2012-11-21: 2 mg via INTRAVENOUS
  Filled 2012-11-21: qty 1

## 2012-11-21 MED ORDER — METRONIDAZOLE 500 MG PO TABS: 500.0000 mg | ORAL_TABLET | Freq: Two times a day (BID) | ORAL | Status: DC

## 2012-11-21 MED ORDER — SODIUM CHLORIDE 0.9 % IV BOLUS (SEPSIS)
1000.0000 mL | Freq: Once | INTRAVENOUS | Status: AC
  Administered 2012-11-21: 1000 mL via INTRAVENOUS

## 2012-11-21 MED ORDER — METRONIDAZOLE IN NACL 5-0.79 MG/ML-% IV SOLN
500.0000 mg | Freq: Once | INTRAVENOUS | Status: AC
  Administered 2012-11-21: 500 mg via INTRAVENOUS
  Filled 2012-11-21: qty 100

## 2012-11-21 MED ORDER — ONDANSETRON HCL 4 MG/2ML IJ SOLN
4.0000 mg | Freq: Once | INTRAMUSCULAR | Status: AC
  Administered 2012-11-21: 4 mg via INTRAVENOUS
  Filled 2012-11-21: qty 2

## 2012-11-21 MED ORDER — CIPROFLOXACIN IN D5W 400 MG/200ML IV SOLN
400.0000 mg | Freq: Once | INTRAVENOUS | Status: AC
  Administered 2012-11-21: 400 mg via INTRAVENOUS
  Filled 2012-11-21: qty 200

## 2012-11-21 NOTE — ED Notes (Signed)
MD at bedside giving test results and discussing plan of care. 

## 2012-11-21 NOTE — ED Provider Notes (Addendum)
CSN: [id number]     Arrival date & time 11/21/12  1843 History  This chart was scribed for Glynn Octave, MD by Kellison Scrape, ED Scribe. This patient was seen in room MH11/MH11 and the patient's care was started at 7:20 PM.   Chief Complaint  Patient presents with  . Flank Pain  . Nausea  . Fever    The history is provided by the patient. No language interpreter was used.    HPI Comments: Lori Webb is a 72 y.o. female who presents to the Emergency Department complaining of 4 to 5 days of gradual onset, gradually worsening, constant right flank pain that radiates over the right hip. She states that she developed HAs, lower abdominal pain and hematuria yesterday. She reports a fever of 101 yesterday but denies any since. She was seen by her PCP and was diagnosed with an infection yestersday. She was given an antibiotic injection in the office and prescribed Cipro. She states that she took ASA with no improvement. She denies urinary frequency, dysuria, vaginal bleeding or discharge, diarrhea. She reports an appendectomy, two c-sections and partial hysterectomy. She denies having a h/o cardiac or pulmonary conditions or DM.   Past Medical History  Diagnosis Date  . Diverticulosis   . Anxiety    Past Surgical History  Procedure Laterality Date  . Cesarean section      x2  . Thyroidectomy    . Tonsillectomy    . Appendectomy    . Abdominal hysterectomy    . Dilation and curettage of uterus    . Plantar fascia release  2009    left heel  . Colonoscopy    . Mass excision  05/02/2012    Procedure: EXCISION MASS;  Surgeon: Shelly Rubenstein, MD;  Location: [facility];  Service: General;  Laterality: Right;  excision cyst right upper back   Family History  Problem Relation Age of Onset  . Cancer Mother     breast  . Cancer Maternal Grandmother     breast   History  Substance Use Topics  . Smoking status: Never Smoker   . Smokeless tobacco: Not on file   . Alcohol Use: 0.6 oz/week    1 Glasses of wine per week     Comment: 1 glass of wine per night   No OB history provided.  Review of Systems  A complete 10 system review of systems was obtained and all systems are negative except as noted in the HPI and PMH.   Allergies  Review of patient's allergies indicates no known allergies.  Home Medications   Current Outpatient Rx  Name  Route  Sig  Dispense  Refill  . ciprofloxacin (CIPRO XR) 500 MG 24 hr tablet   Oral   Take 500 mg by mouth daily.         Marland Kitchen HYDROcodone-acetaminophen (NORCO/VICODIN) 5-325 MG per tablet   Oral   Take 1 tablet by mouth every 6 (six) hours as needed for pain.         Marland Kitchen acetaminophen (TYLENOL) 650 MG CR tablet   Oral   Take 650 mg by mouth every 8 (eight) hours as needed.         . cetirizine (ZYRTEC) 10 MG tablet   Oral   Take 10 mg by mouth daily.         Marland Kitchen levothyroxine (SYNTHROID, LEVOTHROID) 100 MCG tablet   Oral   Take 100 mcg by mouth daily.         Marland Kitchen  metroNIDAZOLE (FLAGYL) 500 MG tablet   Oral   Take 1 tablet (500 mg total) by mouth 2 (two) times daily.   20 tablet   0   . ondansetron (ZOFRAN) 4 MG tablet   Oral   Take 1 tablet (4 mg total) by mouth every 6 (six) hours.   12 tablet   0    Triage Vitals: BP 124/64  Pulse 64  Temp(Src) 98.4 F (36.9 C) (Oral)  Resp 16  SpO2 99%  Physical Exam  Nursing note and vitals reviewed. Constitutional: She is oriented to person, place, and time. She appears well-developed and well-nourished. No distress.  HENT:  Head: Normocephalic and atraumatic.  Mouth/Throat: Oropharynx is clear and moist.  Eyes: Conjunctivae and EOM are normal. Pupils are equal, round, and reactive to light.  Neck: Neck supple. No tracheal deviation present.  Cardiovascular: Normal rate, regular rhythm and intact distal pulses.   Pulmonary/Chest: Effort normal and breath sounds normal. No respiratory distress.  Abdominal: Soft. There is tenderness  (diffuse lower). There is guarding (voluntary).  Musculoskeletal: Normal range of motion. She exhibits no edema.  Right paraspinal lumbar tenderness, no CVA tenderness  Neurological: She is alert and oriented to person, place, and time. No cranial nerve deficit.  Skin: Skin is warm and dry.  Psychiatric: She has a normal mood and affect. Her behavior is normal.    ED Course   Medications  sodium chloride 0.9 % bolus 1,000 mL (0 mL Intravenous Stopped 11/21/12 2154)  ondansetron (ZOFRAN) injection 4 mg (4 mg Intravenous Given 11/21/12 1939)  morphine 2 MG/ML injection 2 mg (2 mg Intravenous Given 11/21/12 1939)  morphine 2 MG/ML injection 2 mg (2 mg Intravenous Given 11/21/12 2105)  iohexol (OMNIPAQUE) 300 MG/ML solution 100 mL (100 mL Intravenous Contrast Given 11/21/12 2039)  ciprofloxacin (CIPRO) IVPB 400 mg (400 mg Intravenous New Bag/Given 11/21/12 2231)  metroNIDAZOLE (FLAGYL) IVPB 500 mg (0 mg Intravenous Stopped 11/21/12 2230)    DIAGNOSTIC STUDIES: Oxygen Saturation is 99% on room air, normal by my interpretation.    COORDINATION OF CARE: 7:26 PM-Discussed treatment plan which includes medications, CBC panel, CMP and UA with the pt at bedside and pt agreed to plan.   Procedures (including critical care time)  Labs Reviewed  URINALYSIS, ROUTINE W REFLEX MICROSCOPIC - Abnormal; Notable for the following:    Color, Urine AMBER (*)    Hgb urine dipstick TRACE (*)    Bilirubin Urine SMALL (*)    Ketones, ur 15 (*)    All other components within normal limits  COMPREHENSIVE METABOLIC PANEL - Abnormal; Notable for the following:    Potassium 3.3 (*)    Glucose, Bld 121 (*)    ALT 40 (*)    Alkaline Phosphatase 135 (*)    Total Bilirubin 0.2 (*)    GFR calc non Af Amer 62 (*)    GFR calc Af Amer 72 (*)    All other components within normal limits  URINE MICROSCOPIC-ADD ON - Abnormal; Notable for the following:    Bacteria, UA FEW (*)    Casts HYALINE CASTS (*)    All other  components within normal limits  URINE CULTURE  CBC WITH DIFFERENTIAL  LIPASE, BLOOD  CG4 I-STAT (LACTIC ACID)   Ct Abdomen Pelvis W Contrast  11/21/2012   *RADIOLOGY REPORT*  Clinical Data: Flank pain, nausea and fever.  Prior appendectomy and hysterectomy.  CT ABDOMEN AND PELVIS WITH CONTRAST  Technique:  Multidetector CT imaging of the  abdomen and pelvis was performed following the standard protocol during bolus administration of intravenous contrast.  Contrast: OMNIPAQUE IOHEXOL 300 MG/ML  SOLN  Comparison: None.  Findings: Lung bases are within normal.  Abdominal images demonstrate a normal liver, spleen, pancreas, adrenal glands.  There is evidence of cholelithiasis with a 2.9 cm cholesterol stone.  No adjacent pericholecystic fluid or inflammation.  Kidneys normal in size and demonstrate a few tiny sub centimeter bilateral cysts.  There is no hydronephrosis or nephrolithiasis.  Ureters are within normal.  There is mild diverticulosis of the colon.  There is mild inflammatory change adjacent a diverticula at the rectosigmoid junction just left of midline in the upper pelvis compatible with acute diverticulitis. There is no evidence of free air or diverticular abscess. Remaining pelvic structures are unremarkable.  IMPRESSION: Evidence of acute diverticulitis involving the rectosigmoid junction.  No evidence of perforation or abscess.  A few small bilateral renal cysts.  Cholelithiasis with 2.9 cm cholesterol stone present.   Original Report Authenticated By: Elberta Fortis, M.D.   1. Diverticulitis     MDM  4 days of R flank and lower abdominal pain with nausea.  No vomiting or fever.  Saw PCP yesterday and placed on cipro for "infection". Denies vomiting, dysuria, hematuria, diarrhea.  UA not impressive for infection. Lactate normal.  CT obtained given age and diffuse abdominal tenderness.  Uncomplicated diverticulitis on CT.  Cipro and flagyl given. Flagyl added to PO cipro she already  has.  Pain controlled in the ED.  Patient reading book comfortably.  Feels comfortable going home. Return precautions discussed.   I personally performed the services described in this documentation, which was scribed in my presence. The recorded information has been reviewed and is accurate.  BP 111/50  Pulse 52  Temp(Src) 98.4 F (36.9 C) (Oral)  Resp 17  SpO2 98%  Glynn Octave, MD 11/22/12 1610  Glynn Octave, MD 11/22/12 [id number]

## 2012-11-21 NOTE — ED Notes (Signed)
Patient transported to CT 

## 2012-11-21 NOTE — ED Notes (Signed)
Pt reports that Monday she developed right flank pain with hematuria along with nausea.

## 2012-11-22 LAB — URINE CULTURE
Colony Count: NO GROWTH
Culture: NO GROWTH

## 2012-11-23 DIAGNOSIS — K8020 Calculus of gallbladder without cholecystitis without obstruction: Secondary | ICD-10-CM | POA: Diagnosis not present

## 2012-11-23 DIAGNOSIS — K5732 Diverticulitis of large intestine without perforation or abscess without bleeding: Secondary | ICD-10-CM | POA: Diagnosis not present

## 2012-11-23 DIAGNOSIS — K5730 Diverticulosis of large intestine without perforation or abscess without bleeding: Secondary | ICD-10-CM | POA: Diagnosis not present

## 2012-12-04 ENCOUNTER — Encounter (HOSPITAL_BASED_OUTPATIENT_CLINIC_OR_DEPARTMENT_OTHER): Payer: Self-pay | Admitting: *Deleted

## 2012-12-04 ENCOUNTER — Ambulatory Visit (HOSPITAL_BASED_OUTPATIENT_CLINIC_OR_DEPARTMENT_OTHER)
Admit: 2012-12-04 | Discharge: 2012-12-04 | Disposition: A | Discharge status: Home / Self Care | Payer: Medicare Other | Attending: Emergency Medicine | Admitting: Emergency Medicine

## 2012-12-04 ENCOUNTER — Emergency Department (HOSPITAL_BASED_OUTPATIENT_CLINIC_OR_DEPARTMENT_OTHER)
Admission: EM | Admit: 2012-12-04 | Discharge: 2012-12-04 | Disposition: A | Discharge status: Home / Self Care | Payer: Medicare Other | Attending: Emergency Medicine | Admitting: Emergency Medicine

## 2012-12-04 DIAGNOSIS — Z79899 Other long term (current) drug therapy: Secondary | ICD-10-CM | POA: Diagnosis not present

## 2012-12-04 DIAGNOSIS — K8020 Calculus of gallbladder without cholecystitis without obstruction: Secondary | ICD-10-CM | POA: Diagnosis not present

## 2012-12-04 DIAGNOSIS — Z8659 Personal history of other mental and behavioral disorders: Secondary | ICD-10-CM | POA: Diagnosis not present

## 2012-12-04 DIAGNOSIS — Z8719 Personal history of other diseases of the digestive system: Secondary | ICD-10-CM | POA: Diagnosis not present

## 2012-12-04 DIAGNOSIS — K8050 Calculus of bile duct without cholangitis or cholecystitis without obstruction: Secondary | ICD-10-CM

## 2012-12-04 DIAGNOSIS — R1011 Right upper quadrant pain: Secondary | ICD-10-CM

## 2012-12-04 LAB — COMPREHENSIVE METABOLIC PANEL
ALT: 24 U/L (ref 0–35)
AST: 21 U/L (ref 0–37)
Albumin: 3.4 g/dL — ABNORMAL LOW (ref 3.5–5.2)
Alkaline Phosphatase: 92 U/L (ref 39–117)
BUN: 22 mg/dL (ref 6–23)
CO2: 26 mEq/L (ref 19–32)
Calcium: 9.2 mg/dL (ref 8.4–10.5)
Chloride: 105 mEq/L (ref 96–112)
Creatinine, Ser: 1.2 mg/dL — ABNORMAL HIGH (ref 0.50–1.10)
GFR calc Af Amer: 51 mL/min — ABNORMAL LOW (ref >90)
GFR calc non Af Amer: 44 mL/min — ABNORMAL LOW (ref >90)
Glucose, Bld: 110 mg/dL — ABNORMAL HIGH (ref 70–99)
Potassium: 4.2 mEq/L (ref 3.5–5.1)
Sodium: 139 mEq/L (ref 135–145)
Total Bilirubin: 0.2 mg/dL — ABNORMAL LOW (ref 0.3–1.2)
Total Protein: 6 g/dL (ref 6.0–8.3)

## 2012-12-04 LAB — CBC WITH DIFFERENTIAL/PLATELET
Basophils Absolute: 0.1 10*3/uL (ref 0.0–0.1)
Basophils Relative: 1 % (ref 0–1)
Eosinophils Absolute: 0.2 10*3/uL (ref 0.0–0.7)
Hemoglobin: 13 g/dL (ref 12.0–15.0)
MCHC: 32.4 g/dL (ref 30.0–36.0)
Monocytes Relative: 6 % (ref 3–12)
Neutro Abs: 5.7 10*3/uL (ref 1.7–7.7)
Neutrophils Relative %: 60 % (ref 43–77)
Platelets: 296 10*3/uL (ref 150–400)

## 2012-12-04 LAB — LIPASE, BLOOD: Lipase: 40 U/L (ref 11–59)

## 2012-12-04 MED ORDER — SODIUM CHLORIDE 0.9 % IV SOLN
INTRAVENOUS | Status: DC
  Administered 2012-12-04: 04:00:00 via INTRAVENOUS

## 2012-12-04 MED ORDER — KETOROLAC TROMETHAMINE 30 MG/ML IJ SOLN
30.0000 mg | Freq: Once | INTRAMUSCULAR | Status: AC
  Administered 2012-12-04: 30 mg via INTRAVENOUS
  Filled 2012-12-04: qty 1

## 2012-12-04 MED ORDER — PANTOPRAZOLE SODIUM 40 MG IV SOLR
40.0000 mg | Freq: Once | INTRAVENOUS | Status: AC
  Administered 2012-12-04: 40 mg via INTRAVENOUS
  Filled 2012-12-04: qty 40

## 2012-12-04 NOTE — ED Provider Notes (Signed)
CSN: [id number]     Arrival date & time 12/04/12  0246 History   First MD Initiated Contact with Patient 12/04/12 0309     Chief Complaint  Patient presents with  . Abdominal Pain   (Consider location/radiation/quality/duration/timing/severity/associated sxs/prior Treatment) HPI 72 year old female who was seen here on August 11 and diagnosed with diverticulitis. As an incidental finding the CT scan demonstrated cholelithiasis. She is here with right upper quadrant pain that awoke her from sleep this morning which radiates to the interscapular area. The pain is currently about 4/10 but has been worsening. It is not associated with nausea, vomiting or diarrhea is worse with movement or palpation as well as taking a deep breath. The pain is different than that of her previous diverticulitis. She describes this as more crampy. Her abdomen is not distended. She's not had a fever. She has not taken any pain medication for this because she states hydrocodone "loops her out".  Past Medical History  Diagnosis Date  . Diverticulosis   . Anxiety    Past Surgical History  Procedure Laterality Date  . Cesarean section      x2  . Thyroidectomy    . Tonsillectomy    . Appendectomy    . Abdominal hysterectomy    . Dilation and curettage of uterus    . Plantar fascia release  2009    left heel  . Colonoscopy    . Mass excision  05/02/2012    Procedure: EXCISION MASS;  Surgeon: Shelly Rubenstein, MD;  Location: [facility];  Service: General;  Laterality: Right;  excision cyst right upper back   Family History  Problem Relation Age of Onset  . Cancer Mother     breast  . Cancer Maternal Grandmother     breast   History  Substance Use Topics  . Smoking status: Never Smoker   . Smokeless tobacco: Not on file  . Alcohol Use: 0.6 oz/week    1 Glasses of wine per week     Comment: 1 glass of wine per night   OB History   Grav Para Term Preterm Abortions TAB SAB Ect Mult Living                  Review of Systems  All other systems reviewed and are negative.    Allergies  Review of patient's allergies indicates no known allergies.  Home Medications   Current Outpatient Rx  Name  Route  Sig  Dispense  Refill  . levothyroxine (SYNTHROID, LEVOTHROID) 100 MCG tablet   Oral   Take 100 mcg by mouth daily.         Marland Kitchen acetaminophen (TYLENOL) 650 MG CR tablet   Oral   Take 650 mg by mouth every 8 (eight) hours as needed.         . cetirizine (ZYRTEC) 10 MG tablet   Oral   Take 10 mg by mouth daily.         . ciprofloxacin (CIPRO XR) 500 MG 24 hr tablet   Oral   Take 500 mg by mouth daily.         Marland Kitchen HYDROcodone-acetaminophen (NORCO/VICODIN) 5-325 MG per tablet   Oral   Take 1 tablet by mouth every 6 (six) hours as needed for pain.         . metroNIDAZOLE (FLAGYL) 500 MG tablet   Oral   Take 1 tablet (500 mg total) by mouth 2 (two) times daily.   20  tablet   0   . ondansetron (ZOFRAN) 4 MG tablet   Oral   Take 1 tablet (4 mg total) by mouth every 6 (six) hours.   12 tablet   0    BP 128/55  Pulse 54  Temp(Src) 97.9 F (36.6 C) (Oral)  Resp 16  SpO2 99%  Physical Exam General: Well-developed, well-nourished female in no acute distress; appearance consistent with age of record HENT: normocephalic; atraumatic Eyes: pupils equal, round and reactive to light; extraocular muscles intact Neck: supple Heart: regular rate and rhythm Lungs: clear to auscultation bilaterally Abdomen: soft; nondistended; right upper quadrant tenderness with positive Murphy sign; no masses or hepatosplenomegaly; bowel sounds present Extremities: No deformity; full range of motion; pulses normal; no edema Neurologic: Awake, alert and oriented; motor function intact in all extremities and symmetric; no facial droop Skin: Warm and dry Psychiatric: Normal mood and affect    ED Course  Procedures (including critical care time)   MDM   Nursing notes and  vitals signs, including pulse oximetry, reviewed.  Summary of this visit's results, reviewed by myself:  Labs:  Results for orders placed during the hospital encounter of 12/04/12 (from the past 24 hour(s))  COMPREHENSIVE METABOLIC PANEL     Status: Abnormal   Collection Time    12/04/12  3:25 AM      Result Value Range   Sodium 139  135 - 145 mEq/L   Potassium 4.2  3.5 - 5.1 mEq/L   Chloride 105  96 - 112 mEq/L   CO2 26  19 - 32 mEq/L   Glucose, Bld 110 (*) 70 - 99 mg/dL   BUN 22  6 - 23 mg/dL   Creatinine, Ser 1.61 (*) 0.50 - 1.10 mg/dL   Calcium 9.2  8.4 - 09.6 mg/dL   Total Protein 6.0  6.0 - 8.3 g/dL   Albumin 3.4 (*) 3.5 - 5.2 g/dL   AST 21  0 - 37 U/L   ALT 24  0 - 35 U/L   Alkaline Phosphatase 92  39 - 117 U/L   Total Bilirubin 0.2 (*) 0.3 - 1.2 mg/dL   GFR calc non Af Amer 44 (*) >90 mL/min   GFR calc Af Amer 51 (*) >90 mL/min  LIPASE, BLOOD     Status: None   Collection Time    12/04/12  3:25 AM      Result Value Range   Lipase 40  11 - 59 U/L  CBC WITH DIFFERENTIAL     Status: None   Collection Time    12/04/12  3:25 AM      Result Value Range   WBC 9.5  4.0 - 10.5 K/uL   RBC 4.21  3.87 - 5.11 MIL/uL   Hemoglobin 13.0  12.0 - 15.0 g/dL   HCT 04.5  40.9 - 81.1 %   MCV 95.2  78.0 - 100.0 fL   MCH 30.9  26.0 - 34.0 pg   MCHC 32.4  30.0 - 36.0 g/dL   RDW 91.4  78.2 - 95.6 %   Platelets 296  150 - 400 K/uL   Neutrophils Relative % 60  43 - 77 %   Neutro Abs 5.7  1.7 - 7.7 K/uL   Lymphocytes Relative 32  12 - 46 %   Lymphs Abs 3.0  0.7 - 4.0 K/uL   Monocytes Relative 6  3 - 12 %   Monocytes Absolute 0.5  0.1 - 1.0 K/uL   Eosinophils Relative  2  0 - 5 %   Eosinophils Absolute 0.2  0.0 - 0.7 K/uL   Basophils Relative 1  0 - 1 %   Basophils Absolute 0.1  0.0 - 0.1 K/uL   4:27 AM Pain improved after IV portal. Patient wishes to avoid narcotics. We will have her return later this morning for an ultrasound.  Hanley Seamen, MD 12/04/12 [phone]

## 2012-12-04 NOTE — ED Notes (Signed)
MD at bedside. 

## 2012-12-04 NOTE — ED Notes (Signed)
Pt here on 8/11 and dx'd with gall stones, pt has scheduled follow-up appt in September. Pt woke tonight from sleep with RUQ pain and pain between shoulder blades.

## 2012-12-11 ENCOUNTER — Ambulatory Visit (INDEPENDENT_AMBULATORY_CARE_PROVIDER_SITE_OTHER): Payer: Medicare Other | Admitting: Surgery

## 2012-12-11 ENCOUNTER — Encounter (INDEPENDENT_AMBULATORY_CARE_PROVIDER_SITE_OTHER): Payer: Self-pay | Admitting: Surgery

## 2012-12-11 VITALS — BP 114/74 | HR 68 | Resp 16 | Ht 63.0 in | Wt 145.2 lb

## 2012-12-11 DIAGNOSIS — K8020 Calculus of gallbladder without cholecystitis without obstruction: Secondary | ICD-10-CM

## 2012-12-11 MED ORDER — AMOXICILLIN-POT CLAVULANATE 875-125 MG PO TABS: 1.0000 | ORAL_TABLET | Freq: Two times a day (BID) | ORAL | Status: DC

## 2012-12-11 NOTE — Progress Notes (Signed)
Subjective:     Patient ID: Lori Webb, female   DOB: 1941-03-14, 72 y.o.   MRN: [id number]  HPI This is a patient of mine and I removed the sebaceous cyst on her back earlier this year. She recently had a mild bout of diverticulitis. This then caused an episode of symptomatic cholelithiasis. Currently she has some back pain but her symptoms improved. She did have some nausea and vomiting recently.  Review of Systems     Objective:   Physical Exam On exam, she is well appearance. There is minimal discomfort. There is no tenderness in left lower quadrant    Assessment:     Symptomatic cholelithiasis     Plan:     Laparoscopic cholecystectomy is recommended. I discussed admission to the hospital today versus urgent surgery next week. She was to wait until next week as she has to take her husband who is disabled for surgery. I believe this is reasonable based on how she will today. If her condition worsens, she will go to the emergency department. I am going to place her on antibiotics.

## 2012-12-12 ENCOUNTER — Observation Stay (HOSPITAL_COMMUNITY): Payer: Medicare Other

## 2012-12-12 ENCOUNTER — Encounter (HOSPITAL_COMMUNITY): Payer: Self-pay | Admitting: Emergency Medicine

## 2012-12-12 ENCOUNTER — Inpatient Hospital Stay (HOSPITAL_COMMUNITY)
Admission: EM | Admit: 2012-12-12 | Discharge: 2012-12-18 | DRG: 419 | Disposition: A | Discharge status: Home / Self Care | Payer: Medicare Other | Attending: Surgery | Admitting: Surgery

## 2012-12-12 DIAGNOSIS — K8061 Calculus of gallbladder and bile duct with cholecystitis, unspecified, with obstruction: Secondary | ICD-10-CM | POA: Diagnosis not present

## 2012-12-12 DIAGNOSIS — F411 Generalized anxiety disorder: Secondary | ICD-10-CM | POA: Diagnosis present

## 2012-12-12 DIAGNOSIS — K219 Gastro-esophageal reflux disease without esophagitis: Secondary | ICD-10-CM | POA: Diagnosis present

## 2012-12-12 DIAGNOSIS — K838 Other specified diseases of biliary tract: Secondary | ICD-10-CM

## 2012-12-12 DIAGNOSIS — K8010 Calculus of gallbladder with chronic cholecystitis without obstruction: Secondary | ICD-10-CM | POA: Diagnosis not present

## 2012-12-12 DIAGNOSIS — K8021 Calculus of gallbladder without cholecystitis with obstruction: Secondary | ICD-10-CM

## 2012-12-12 DIAGNOSIS — K5900 Constipation, unspecified: Secondary | ICD-10-CM | POA: Diagnosis not present

## 2012-12-12 DIAGNOSIS — K8065 Calculus of gallbladder and bile duct with chronic cholecystitis with obstruction: Principal | ICD-10-CM | POA: Diagnosis present

## 2012-12-12 DIAGNOSIS — Z79899 Other long term (current) drug therapy: Secondary | ICD-10-CM

## 2012-12-12 DIAGNOSIS — I498 Other specified cardiac arrhythmias: Secondary | ICD-10-CM | POA: Diagnosis present

## 2012-12-12 DIAGNOSIS — K5730 Diverticulosis of large intestine without perforation or abscess without bleeding: Secondary | ICD-10-CM | POA: Diagnosis not present

## 2012-12-12 DIAGNOSIS — K8050 Calculus of bile duct without cholangitis or cholecystitis without obstruction: Secondary | ICD-10-CM | POA: Diagnosis not present

## 2012-12-12 DIAGNOSIS — K8020 Calculus of gallbladder without cholecystitis without obstruction: Secondary | ICD-10-CM | POA: Diagnosis not present

## 2012-12-12 DIAGNOSIS — R112 Nausea with vomiting, unspecified: Secondary | ICD-10-CM | POA: Diagnosis present

## 2012-12-12 DIAGNOSIS — R109 Unspecified abdominal pain: Secondary | ICD-10-CM | POA: Diagnosis not present

## 2012-12-12 DIAGNOSIS — E039 Hypothyroidism, unspecified: Secondary | ICD-10-CM

## 2012-12-12 DIAGNOSIS — R7989 Other specified abnormal findings of blood chemistry: Secondary | ICD-10-CM | POA: Diagnosis not present

## 2012-12-12 DIAGNOSIS — Q441 Other congenital malformations of gallbladder: Secondary | ICD-10-CM | POA: Diagnosis not present

## 2012-12-12 DIAGNOSIS — Z8719 Personal history of other diseases of the digestive system: Secondary | ICD-10-CM

## 2012-12-12 DIAGNOSIS — E890 Postprocedural hypothyroidism: Secondary | ICD-10-CM | POA: Diagnosis not present

## 2012-12-12 DIAGNOSIS — K8071 Calculus of gallbladder and bile duct without cholecystitis with obstruction: Secondary | ICD-10-CM | POA: Diagnosis not present

## 2012-12-12 HISTORY — DX: Calculus of gallbladder without cholecystitis with obstruction: K80.21

## 2012-12-12 HISTORY — DX: Personal history of other diseases of the digestive system: Z87.19

## 2012-12-12 HISTORY — DX: Hypothyroidism, unspecified: E03.9

## 2012-12-12 LAB — URINALYSIS, ROUTINE W REFLEX MICROSCOPIC
Glucose, UA: NEGATIVE mg/dL
Ketones, ur: NEGATIVE mg/dL
Nitrite: POSITIVE — AB
Protein, ur: NEGATIVE mg/dL
pH: 5.5 (ref 5.0–8.0)

## 2012-12-12 LAB — CBC WITH DIFFERENTIAL/PLATELET
Hemoglobin: 14.1 g/dL (ref 12.0–15.0)
Lymphocytes Relative: 20 % (ref 12–46)
Lymphs Abs: 1.3 10*3/uL (ref 0.7–4.0)
Monocytes Relative: 5 % (ref 3–12)
Neutro Abs: 4.7 10*3/uL (ref 1.7–7.7)
Neutrophils Relative %: 73 % (ref 43–77)
Platelets: 296 10*3/uL (ref 150–400)
RBC: 4.6 MIL/uL (ref 3.87–5.11)
WBC: 6.5 10*3/uL (ref 4.0–10.5)

## 2012-12-12 LAB — COMPREHENSIVE METABOLIC PANEL
ALT: 498 U/L — ABNORMAL HIGH (ref 0–35)
Alkaline Phosphatase: 199 U/L — ABNORMAL HIGH (ref 39–117)
BUN: 14 mg/dL (ref 6–23)
Chloride: 102 mEq/L (ref 96–112)
GFR calc Af Amer: >90 mL/min (ref >90)
Glucose, Bld: 103 mg/dL — ABNORMAL HIGH (ref 70–99)
Potassium: 3.6 mEq/L (ref 3.5–5.1)
Sodium: 137 mEq/L (ref 135–145)
Total Bilirubin: 6.1 mg/dL — ABNORMAL HIGH (ref 0.3–1.2)
Total Protein: 6.8 g/dL (ref 6.0–8.3)

## 2012-12-12 LAB — URINE MICROSCOPIC-ADD ON

## 2012-12-12 LAB — SURGICAL PCR SCREEN: MRSA, PCR: NEGATIVE

## 2012-12-12 MED ORDER — ACETAMINOPHEN 325 MG PO TABS
650.0000 mg | ORAL_TABLET | Freq: Four times a day (QID) | ORAL | Status: DC | PRN
  Administered 2012-12-15: 650 mg via ORAL
  Filled 2012-12-12: qty 2

## 2012-12-12 MED ORDER — PROMETHAZINE HCL 25 MG/ML IJ SOLN
12.5000 mg | INTRAMUSCULAR | Status: DC | PRN
  Administered 2012-12-12: 12.5 mg via INTRAVENOUS
  Filled 2012-12-12: qty 1

## 2012-12-12 MED ORDER — LEVOTHYROXINE SODIUM 100 MCG PO TABS
100.0000 ug | ORAL_TABLET | Freq: Every day | ORAL | Status: DC
  Administered 2012-12-13 – 2012-12-18 (×5): 100 ug via ORAL
  Filled 2012-12-12 (×7): qty 1

## 2012-12-12 MED ORDER — ONDANSETRON HCL 4 MG/2ML IJ SOLN
4.0000 mg | Freq: Four times a day (QID) | INTRAMUSCULAR | Status: DC | PRN
  Administered 2012-12-12 – 2012-12-16 (×6): 4 mg via INTRAVENOUS
  Filled 2012-12-12 (×6): qty 2

## 2012-12-12 MED ORDER — LORATADINE 10 MG PO TABS
10.0000 mg | ORAL_TABLET | Freq: Every day | ORAL | Status: DC
  Administered 2012-12-12: 10 mg via ORAL
  Filled 2012-12-12 (×7): qty 1

## 2012-12-12 MED ORDER — OXYCODONE HCL 5 MG PO TABS
5.0000 mg | ORAL_TABLET | ORAL | Status: DC | PRN
  Administered 2012-12-15: 10 mg via ORAL
  Filled 2012-12-12: qty 2

## 2012-12-12 MED ORDER — ONDANSETRON HCL 4 MG/2ML IJ SOLN
4.0000 mg | Freq: Once | INTRAMUSCULAR | Status: AC
  Administered 2012-12-12: 4 mg via INTRAVENOUS
  Filled 2012-12-12: qty 2

## 2012-12-12 MED ORDER — MORPHINE SULFATE 4 MG/ML IJ SOLN
4.0000 mg | Freq: Once | INTRAMUSCULAR | Status: AC
  Administered 2012-12-12: 4 mg via INTRAVENOUS
  Filled 2012-12-12: qty 1

## 2012-12-12 MED ORDER — MORPHINE SULFATE 2 MG/ML IJ SOLN
1.0000 mg | INTRAMUSCULAR | Status: DC | PRN
  Administered 2012-12-12: 4 mg via INTRAVENOUS
  Administered 2012-12-13 (×2): 2 mg via INTRAVENOUS
  Filled 2012-12-12: qty 1
  Filled 2012-12-12: qty 2
  Filled 2012-12-12: qty 1

## 2012-12-12 MED ORDER — DIPHENHYDRAMINE HCL 12.5 MG/5ML PO ELIX: 12.5000 mg | ORAL_SOLUTION | Freq: Four times a day (QID) | ORAL | Status: DC | PRN

## 2012-12-12 MED ORDER — KCL IN DEXTROSE-NACL 20-5-0.45 MEQ/L-%-% IV SOLN
INTRAVENOUS | Status: DC
  Administered 2012-12-12 – 2012-12-17 (×11): via INTRAVENOUS
  Filled 2012-12-12 (×14): qty 1000

## 2012-12-12 MED ORDER — CEFAZOLIN SODIUM-DEXTROSE 2-3 GM-% IV SOLR
2.0000 g | INTRAVENOUS | Status: DC
  Filled 2012-12-12: qty 50

## 2012-12-12 MED ORDER — DIPHENHYDRAMINE HCL 50 MG/ML IJ SOLN: 12.5000 mg | Freq: Four times a day (QID) | INTRAMUSCULAR | Status: DC | PRN

## 2012-12-12 MED ORDER — CIPROFLOXACIN IN D5W 400 MG/200ML IV SOLN
400.0000 mg | Freq: Two times a day (BID) | INTRAVENOUS | Status: DC
  Administered 2012-12-12 – 2012-12-18 (×12): 400 mg via INTRAVENOUS
  Filled 2012-12-12 (×12): qty 200

## 2012-12-12 MED ORDER — ACETAMINOPHEN 650 MG RE SUPP: 650.0000 mg | Freq: Four times a day (QID) | RECTAL | Status: DC | PRN

## 2012-12-12 MED ORDER — HEPARIN SODIUM (PORCINE) 5000 UNIT/ML IJ SOLN
5000.0000 [IU] | Freq: Three times a day (TID) | INTRAMUSCULAR | Status: DC
  Administered 2012-12-12 – 2012-12-14 (×4): 5000 [IU] via SUBCUTANEOUS
  Filled 2012-12-12 (×8): qty 1

## 2012-12-12 MED ORDER — FAMOTIDINE IN NACL 20-0.9 MG/50ML-% IV SOLN
20.0000 mg | Freq: Two times a day (BID) | INTRAVENOUS | Status: DC
  Administered 2012-12-12 – 2012-12-18 (×9): 20 mg via INTRAVENOUS
  Filled 2012-12-12 (×12): qty 50

## 2012-12-12 MED ORDER — SODIUM CHLORIDE 0.9 % IV BOLUS (SEPSIS)
1000.0000 mL | Freq: Once | INTRAVENOUS | Status: AC
  Administered 2012-12-12: 1000 mL via INTRAVENOUS

## 2012-12-12 NOTE — Progress Notes (Signed)
UR completed 

## 2012-12-12 NOTE — Progress Notes (Signed)
Discussed admission status with Dr. Johna Sheriff.

## 2012-12-12 NOTE — ED Notes (Signed)
Pt reports being seen yesterday by Dr. Rayburn Ma and had a gallbladder removal scheduled for 9/11. Pt was instructed to report to the ED if her symptoms become worse and was unable to await surgery. Pt reports right lower abdominal pain, nausea, and emesis.  Pt reports last eating or drinking at 0815 today. Pt is A/O x4 and in NAD.

## 2012-12-12 NOTE — Progress Notes (Signed)
Patient declines MyChart activation-not interested 

## 2012-12-12 NOTE — H&P (Signed)
Lori Webb is an 72 y.o. female.   Chief Complaint: Abdominal pain, nausea and some vomiting HPI: Pt of Dr. Magnus Ivan who had a sebaceous cyst remove, then on 11/21/12 had her first bout of diverticulitis.  She was treated as an outpatient.   She returned to the ER on 12/04/12 with abdominal pain, and found to have cholelithiasis.  She was referred to Dr. Magnus Ivan who saw her yesterday 12/11/12.  He planned to do cholecystectomy next week.  She returns to ER today with worsening pain, nausea and some vomiting/dry heaves. Her LFT's are all elevated, she says her urine is tea colored, WBC is normal.  We will admit her and recheck her abdominal ultrasound.   Past Medical History  Diagnosis Date  . Diverticulosis   . Anxiety  -  She denies this. Hx of GERD 2007, she denies this now. S/p thyroidectomy with goiter, now on supplement, for hypothyroid.     Past Surgical History  Procedure Laterality Date  . Cesarean section      x2  . Thyroidectomy    . Tonsillectomy    . Appendectomy    . Abdominal hysterectomy    . Dilation and curettage of uterus    . Plantar fascia release  2009    left heel  . Colonoscopy    . Mass excision  05/02/2012    Procedure: EXCISION MASS;  Surgeon: Shelly Rubenstein, MD;  Location: [facility];  Service: General;  Laterality: Right;  excision cyst right upper back    Family History  Problem Relation Age of Onset  . Cancer Mother     breast  . Cancer Maternal Grandmother     breast   Social History:  reports that she has never smoked. She has never used smokeless tobacco. She reports that she drinks about 0.6 ounces of alcohol per week. She reports that she does not use illicit drugs.  Allergies: No Known Allergies Prior to Admission medications   Medication Sig Start Date End Date Taking? Authorizing Provider  amoxicillin-clavulanate (AUGMENTIN) 875-125 MG per tablet Take 1 tablet by mouth 2 (two) times daily. 12/11/12 12/25/12 Yes Shelly Rubenstein, MD  cetirizine (ZYRTEC) 10 MG tablet Take 10 mg by mouth daily as needed for allergies.    Yes Historical Provider, MD  HYDROcodone-acetaminophen (NORCO/VICODIN) 5-325 MG per tablet Take 1 tablet by mouth every 6 (six) hours as needed for pain.   Yes Historical Provider, MD  levothyroxine (SYNTHROID, LEVOTHROID) 100 MCG tablet Take 100 mcg by mouth daily.   Yes Historical Provider, MD      Results for orders placed during the hospital encounter of 12/12/12 (from the past 48 hour(s))  URINALYSIS, ROUTINE W REFLEX MICROSCOPIC     Status: Abnormal   Collection Time    12/12/12 12:30 PM      Result Value Range   Color, Urine ORANGE (*) YELLOW   Comment: BIOCHEMICALS MAY BE AFFECTED BY COLOR   APPearance CLEAR  CLEAR   Specific Gravity, Urine 1.021  1.005 - 1.030   pH 5.5  5.0 - 8.0   Glucose, UA NEGATIVE  NEGATIVE mg/dL   Hgb urine dipstick NEGATIVE  NEGATIVE   Bilirubin Urine LARGE (*) NEGATIVE   Ketones, ur NEGATIVE  NEGATIVE mg/dL   Protein, ur NEGATIVE  NEGATIVE mg/dL   Urobilinogen, UA 1.0  0.0 - 1.0 mg/dL   Nitrite POSITIVE (*) NEGATIVE   Leukocytes, UA SMALL (*) NEGATIVE  URINE MICROSCOPIC-ADD ON  Status: None   Collection Time    12/12/12 12:30 PM      Result Value Range   Squamous Epithelial / LPF RARE  RARE   WBC, UA 3-6  <3 WBC/hpf   Bacteria, UA RARE  RARE   Urine-Other MUCOUS PRESENT    CBC WITH DIFFERENTIAL     Status: None   Collection Time    12/12/12 12:35 PM      Result Value Range   WBC 6.5  4.0 - 10.5 K/uL   RBC 4.60  3.87 - 5.11 MIL/uL   Hemoglobin 14.1  12.0 - 15.0 g/dL   HCT 96.0  45.4 - 09.8 %   MCV 94.1  78.0 - 100.0 fL   MCH 30.7  26.0 - 34.0 pg   MCHC 32.6  30.0 - 36.0 g/dL   RDW 11.9  14.7 - 82.9 %   Platelets 296  150 - 400 K/uL   Neutrophils Relative % 73  43 - 77 %   Neutro Abs 4.7  1.7 - 7.7 K/uL   Lymphocytes Relative 20  12 - 46 %   Lymphs Abs 1.3  0.7 - 4.0 K/uL   Monocytes Relative 5  3 - 12 %   Monocytes Absolute 0.3   0.1 - 1.0 K/uL   Eosinophils Relative 1  0 - 5 %   Eosinophils Absolute 0.1  0.0 - 0.7 K/uL   Basophils Relative 1  0 - 1 %   Basophils Absolute 0.1  0.0 - 0.1 K/uL  COMPREHENSIVE METABOLIC PANEL     Status: Abnormal   Collection Time    12/12/12 12:35 PM      Result Value Range   Sodium 137  135 - 145 mEq/L   Potassium 3.6  3.5 - 5.1 mEq/L   Chloride 102  96 - 112 mEq/L   CO2 27  19 - 32 mEq/L   Glucose, Bld 103 (*) 70 - 99 mg/dL   BUN 14  6 - 23 mg/dL   Creatinine, Ser 5.62  0.50 - 1.10 mg/dL   Calcium 9.4  8.4 - 13.0 mg/dL   Total Protein 6.8  6.0 - 8.3 g/dL   Albumin 3.7  3.5 - 5.2 g/dL   AST 865 (*) 0 - 37 U/L   ALT 498 (*) 0 - 35 U/L   Alkaline Phosphatase 199 (*) 39 - 117 U/L   Total Bilirubin 6.1 (*) 0.3 - 1.2 mg/dL   GFR calc non Af Amer 84 (*) >90 mL/min   GFR calc Af Amer >90  >90 mL/min   Comment: (NOTE)     The eGFR has been calculated using the CKD EPI equation.     This calculation has not been validated in all clinical situations.     eGFR's persistently <90 mL/min signify possible Chronic Kidney     Disease.  LIPASE, BLOOD     Status: None   Collection Time    12/12/12 12:35 PM      Result Value Range   Lipase 25  11 - 59 U/L   No results found.  Review of Systems  Constitutional: Negative for fever, chills, weight loss, malaise/fatigue and diaphoresis.       She isn't sure about weight loss.  HENT: Negative.   Eyes: Negative for blurred vision, double vision, photophobia, pain, discharge and redness.       Wears glasses  Respiratory: Negative.   Gastrointestinal: Positive for nausea (this AM first time, mostly dry heaves.),  vomiting and abdominal pain (mostly on the right  and the RUQ, lateral aspect.). Negative for heartburn, diarrhea, constipation, blood in stool and melena.       No recent diarrhea with the resolution of diverticulitis  Genitourinary: Negative.   Musculoskeletal: Negative.   Skin: Negative.   Neurological: Negative.  Negative for  weakness.  Endo/Heme/Allergies: Negative.   Psychiatric/Behavioral: Negative.     Blood pressure 123/63, pulse 53, temperature 98.6 F (37 C), temperature source Oral, resp. rate 17, SpO2 100.00%. Physical Exam  Constitutional: She appears well-developed and well-nourished. No distress.  HENT:  Head: Normocephalic and atraumatic.  Nose: Nose normal.  Mouth/Throat: No oropharyngeal exudate.  Eyes: Conjunctivae and EOM are normal. Pupils are equal, round, and reactive to light. Right eye exhibits no discharge. Left eye exhibits no discharge. No scleral icterus.  Neck: Normal range of motion. Neck supple. No JVD present. No tracheal deviation present. No thyromegaly present.  Cardiovascular: Normal rate, regular rhythm, normal heart sounds and intact distal pulses.  Exam reveals no gallop.   No murmur heard. Respiratory: Effort normal and breath sounds normal. No stridor. No respiratory distress. She has no wheezes. She has no rales. She exhibits no tenderness.  GI: Soft. Bowel sounds are normal. She exhibits no distension. There is tenderness.  Midline incision below the umblicus  Lymphadenopathy:    She has no cervical adenopathy.  Neurological: Abnormal coordination: RUQ, Right lateral aspect is where pain is worst.  Skin: Skin is warm and dry. No rash noted. She is not diaphoretic. No erythema. No pallor.  Psychiatric: She has a normal mood and affect. Her behavior is normal. Judgment and thought content normal.     Assessment/Plan: 1.  Cholelithiasis with obstruction. 2.  Hypothyroid on supplement 3.  Hx of diverticulitis 11/21/12  Plan:  Dr. Abbey Chatters will review.  Recheck LFT, in AM.  I have ask GI to see incase she has to evaluate for ERCP.  Cholecystectomy when ready.  Continue antibiotics for now.  Lori Webb 12/12/2012, 2:56 PM

## 2012-12-13 ENCOUNTER — Inpatient Hospital Stay (HOSPITAL_COMMUNITY): Payer: Medicare Other

## 2012-12-13 ENCOUNTER — Encounter (HOSPITAL_COMMUNITY): Admission: EM | Disposition: A | Discharge status: Home / Self Care | Payer: Self-pay | Source: Home / Self Care

## 2012-12-13 ENCOUNTER — Encounter (HOSPITAL_COMMUNITY): Payer: Self-pay | Admitting: *Deleted

## 2012-12-13 DIAGNOSIS — K838 Other specified diseases of biliary tract: Secondary | ICD-10-CM

## 2012-12-13 DIAGNOSIS — K8050 Calculus of bile duct without cholangitis or cholecystitis without obstruction: Secondary | ICD-10-CM | POA: Diagnosis not present

## 2012-12-13 DIAGNOSIS — E890 Postprocedural hypothyroidism: Secondary | ICD-10-CM | POA: Diagnosis not present

## 2012-12-13 DIAGNOSIS — I498 Other specified cardiac arrhythmias: Secondary | ICD-10-CM | POA: Diagnosis not present

## 2012-12-13 DIAGNOSIS — K8010 Calculus of gallbladder with chronic cholecystitis without obstruction: Secondary | ICD-10-CM | POA: Diagnosis not present

## 2012-12-13 DIAGNOSIS — K8021 Calculus of gallbladder without cholecystitis with obstruction: Secondary | ICD-10-CM

## 2012-12-13 DIAGNOSIS — R7989 Other specified abnormal findings of blood chemistry: Secondary | ICD-10-CM | POA: Diagnosis not present

## 2012-12-13 DIAGNOSIS — K8020 Calculus of gallbladder without cholecystitis without obstruction: Secondary | ICD-10-CM | POA: Diagnosis not present

## 2012-12-13 DIAGNOSIS — K8065 Calculus of gallbladder and bile duct with chronic cholecystitis with obstruction: Secondary | ICD-10-CM | POA: Diagnosis not present

## 2012-12-13 HISTORY — PX: ERCP: SHX5425

## 2012-12-13 LAB — CBC
MCV: 95 fL (ref 78.0–100.0)
Platelets: 228 10*3/uL (ref 150–400)
RDW: 13.4 % (ref 11.5–15.5)
WBC: 5.1 10*3/uL (ref 4.0–10.5)

## 2012-12-13 LAB — COMPREHENSIVE METABOLIC PANEL
Albumin: 2.9 g/dL — ABNORMAL LOW (ref 3.5–5.2)
Alkaline Phosphatase: 164 U/L — ABNORMAL HIGH (ref 39–117)
BUN: 10 mg/dL (ref 6–23)
CO2: 28 mEq/L (ref 19–32)
Chloride: 108 mEq/L (ref 96–112)
Creatinine, Ser: 0.79 mg/dL (ref 0.50–1.10)
GFR calc non Af Amer: 81 mL/min — ABNORMAL LOW (ref >90)
Glucose, Bld: 104 mg/dL — ABNORMAL HIGH (ref 70–99)
Potassium: 4.3 mEq/L (ref 3.5–5.1)
Total Bilirubin: 2.3 mg/dL — ABNORMAL HIGH (ref 0.3–1.2)

## 2012-12-13 LAB — URINE CULTURE: Colony Count: NO GROWTH

## 2012-12-13 SURGERY — ERCP, WITH INTERVENTION IF INDICATED
Anesthesia: Moderate Sedation

## 2012-12-13 MED ORDER — SODIUM CHLORIDE 0.9 % IV SOLN: INTRAVENOUS | Status: DC

## 2012-12-13 MED ORDER — MIDAZOLAM HCL 10 MG/2ML IJ SOLN
INTRAMUSCULAR | Status: DC | PRN
  Administered 2012-12-13 (×3): 2.5 mg via INTRAVENOUS

## 2012-12-13 MED ORDER — INDOMETHACIN 50 MG RE SUPP
100.0000 mg | Freq: Once | RECTAL | Status: AC
  Administered 2012-12-13: 100 mg via RECTAL
  Filled 2012-12-13: qty 2

## 2012-12-13 MED ORDER — IOHEXOL 350 MG/ML SOLN
INTRAVENOUS | Status: DC | PRN
  Administered 2012-12-13: 13:00:00

## 2012-12-13 MED ORDER — DIPHENHYDRAMINE HCL 50 MG/ML IJ SOLN
INTRAMUSCULAR | Status: DC | PRN
  Administered 2012-12-13: 25 mg via INTRAVENOUS

## 2012-12-13 MED ORDER — FENTANYL CITRATE 0.05 MG/ML IJ SOLN
INTRAMUSCULAR | Status: DC | PRN
  Administered 2012-12-13 (×4): 25 ug via INTRAVENOUS

## 2012-12-13 NOTE — Progress Notes (Signed)
ERCP results noted.  Plan to proceed with cholecystectomy tomorrow.

## 2012-12-13 NOTE — Progress Notes (Signed)
During ercp patient given 7.5mg  versed . Versed2.5mg  waisted at procedure end.  Laurie Panda CGRN witnessed by Morene Crocker R.N..

## 2012-12-13 NOTE — Progress Notes (Signed)
Patient ID: Lori Webb, female   DOB: 08/18/1940, 72 y.o.   MRN: [id number]    Subjective: Pt a little upset regarding being told one thing yesterday and then another today, explained to her the situation and she calmed down, denies n/v/abd pain, does have some back pain but not bad now that she is not eating  Objective: Vital signs in last 24 hours: Temp:  [98.6 F (37 C)-99.8 F (37.7 C)] 98.6 F (37 C) (09/04 0538) Pulse Rate:  [50-63] 50 (09/04 0538) Resp:  [16-18] 18 (09/04 0538) BP: (99-123)/(41-68) 121/64 mmHg (09/04 0538) SpO2:  [95 %-100 %] 100 % (09/04 0538) Weight:  [145 lb 2.2 oz (65.834 kg)] 145 lb 2.2 oz (65.834 kg) (09/03 1559)    Intake/Output from previous day: 09/03 0701 - 09/04 0700 In: 1191.7 [I.V.:1141.7; IV Piggyback:50] Out: 1600 [Urine:1600] Intake/Output this shift:    PE: Abd: tender in RUQ, soft, +BS General: NAD Heart: RRR Lungs: CTA bil  Lab Results:   Recent Labs  12/12/12 1235 12/13/12 0415  WBC 6.5 5.1  HGB 14.1 12.2  HCT 43.3 37.8  PLT 296 228   BMET  Recent Labs  12/12/12 1235 12/13/12 0415  NA 137 140  K 3.6 4.3  CL 102 108  CO2 27 28  GLUCOSE 103* 104*  BUN 14 10  CREATININE 0.72 0.79  CALCIUM 9.4 8.8   PT/INR No results found for this basename: LABPROT, INR,  in the last 72 hours CMP     Component Value Date/Time   NA 140 12/13/2012 0415   K 4.3 12/13/2012 0415   CL 108 12/13/2012 0415   CO2 28 12/13/2012 0415   GLUCOSE 104* 12/13/2012 0415   BUN 10 12/13/2012 0415   CREATININE 0.79 12/13/2012 0415   CALCIUM 8.8 12/13/2012 0415   PROT 5.7* 12/13/2012 0415   ALBUMIN 2.9* 12/13/2012 0415   AST 176* 12/13/2012 0415   ALT 326* 12/13/2012 0415   ALKPHOS 164* 12/13/2012 0415   BILITOT 2.3* 12/13/2012 0415   GFRNONAA 81* 12/13/2012 0415   GFRAA >90 12/13/2012 0415   Lipase     Component Value Date/Time   LIPASE 25 12/12/2012 1235       Studies/Results: US Abdomen Complete  12/12/2012   *RADIOLOGY REPORT*  Clinical Data:   Cholelithiasis with elevated total bilirubin to 6.1  COMPLETE ABDOMINAL ULTRASOUND  Comparison:  12/04/2012 ultrasound  Findings:  Gallbladder:  With wall echo shadow complex consistent with abundant cholelithiasis.  Wall is difficult to evaluate but appears upper normal at 3 mm.  There is a positive sonographic Murphy's sign.  Common bile duct:  7.4 mm  Liver:  No focal lesion identified.  Within normal limits in parenchymal echogenicity.  IVC:  Appears normal.  Pancreas:  No focal abnormality seen.  Spleen:  Normal at 7.3 cm  Right Kidney:  Normal at 11 cm  Left Kidney:  Normal at 11 cm  Abdominal aorta:  No aneurysm identified.  IMPRESSION: Abundant cholelithiasis with positive sonographic Murphy's sign. Interval dilatation of the common bile duct when compared to recent prior study.   Original Report Authenticated By: Esperanza Heir, M.D.    Anti-infectives: Anti-infectives   Start     Dose/Rate Route Frequency Ordered Stop   12/13/12 0600  ceFAZolin (ANCEF) IVPB 2 g/50 mL premix     2 g 100 mL/hr over 30 Minutes Intravenous On call to O.R. 12/12/12 1618 12/14/12 0559   12/12/12 1600  ciprofloxacin (CIPRO) IVPB 400 mg  400 mg 200 mL/hr over 60 Minutes Intravenous 2 times daily 12/12/12 1422         Assessment/Plan Cholelithiasis with dilated CBD: LFTs and bili trending down but CBD was 2.5 normal size, GI will see today to decide about ERCP, if it is done today then would plan lap chole tomorrow, if not done today and scheduling is possible she could have lap chole with IOC today, spoke with Shanda Bumps from GI who is coming to see patient now.   LOS: 1 day    Ulice Follett 12/13/2012

## 2012-12-13 NOTE — Progress Notes (Signed)
Pt's pocketbook at bedside. Pt reported having Synthroid from home. Advised pt of hospital policy to send home meds to pharmacy or home with family. Pt verbalized understanding yet insisted on keeping pocketbook at bedside. Husband at bedside and aware.

## 2012-12-13 NOTE — Op Note (Signed)
[facility] [address] [place], [address]   ERCP PROCEDURE REPORT  PATIENT: Lori Webb, Lori Webb.  MR# :[id number] BIRTHDATE: [birth year]  GENDER: Female ENDOSCOPIST: Iva Boop, MD, [facility] REFERRED BY:   Dr. Johna Sheriff PROCEDURE DATE:  12/13/2012 PROCEDURE:   ERCP with sphincterotomy/papillotomy ASA CLASS:   Class II INDICATIONS:suspected or rule out bile duct stones. MEDICATIONS: Benadryl 25 mg IV, Fentanyl 100 mcg IV, Versed, Versed-Detailed 7.5 mg IV, and Cipro 400 mg IV TOPICAL ANESTHETIC: Cetacaine Spray  DESCRIPTION OF PROCEDURE:   After the risks benefits and alternatives of the procedure were thoroughly explained, informed consent was obtained.  The Pentax ERCP [id number]  endoscope was introduced through the mouth  and advanced to the second portion of the duodenum . the esophagus was not seen well. the stomach was normal. the duodenum was normal. Major papilla was somewhat protuberant. It was easily cannulated with a wire and contrast injection revealed a prominent but normal caliber CBD and CHD and normal intrahepatics. the cystic duct had a low insertion and wraps around the distal CBD and there was colonic gas arifact that initially looked like a stone. Biliary sphincterotomy was performeed given suspicion of stone. The balloon was used and several sweeps and an occlusion chmangiogram were negative for stones.   The scope was then completely withdrawn from the patient and the procedure terminated.     COMPLICATIONS: None  ENDOSCOPIC IMPRESSION: 1) Normal cholangiogram s/p sphincterotomy and balloon sweeps (no stones). I suspect she passsed a CBD stone. 2) Low insertion of cystic duct 3) No pancreatogram by intent 4) Normal stomach and duodenum otherwise RECOMMENDATIONS: 1) Clears 2) Lap chole per [facility]     eSigned:  Iva Boop, MD, [facility] 12/13/2012 1:13 PM   CC: Mila Palmer, MD

## 2012-12-13 NOTE — Consult Note (Signed)
Referring Provider: No ref. provider found Primary Care Physician:  [name], MD Primary Gastroenterologist:  None, unassigned  Reason for Consultation:  Choledocholithiasis  HPI: Lori Webb is a 72 y.o. female who is a patient of Dr. Eliberto Ivory who had a sebaceous cyst remove by him earlier this year.  On 11/21/12 she had her first bout of diverticulitis and was treated as an outpatient with cipro and flagyl.  This had been seen on CT scan.  She followed up with her PCP who recommended that she see the surgeons again about cholecystectomy since several stones were seen in the GB on CT as well.  She returned to the ER on 12/04/12 in the interim with abdominal pain and ultrasound once again showed gallstones but normal CBD and LFT's were normal.  She was referred to Dr. Magnus Ivan who saw her on 12/11/12.  He planned to do cholecystectomy next week, but she returned to ER again last night with worsening pain, nausea, and some vomiting/dry heaves.  Pain is in RUQ/epigastrium and radiates into her right back.  Her LFT's were all elevated with bili of 6.1.  She says her urine was tea colored/looked like maple syrup.  WBC is normal.  She was placed on cipro and admitted to surgical service.  Repeat ultrasound showed CBD with significant increase in dilation compare to ultrasound one week ago.  GI was consulted.  This AM her LFT's are trending down with bili of 2.3.  Still has pain on right side.   Past Medical History  Diagnosis Date  . Diverticulosis   . Anxiety   . Cholelithiasis with obstruction 12/12/2012  . Hypothyroid 12/12/2012    S/p thyroidectomy for Goiter.  Marland Kitchen Hx of diverticulitis of colon 12/12/2012    11/21/12 first diagnosis of this.    Past Surgical History  Procedure Laterality Date  . Cesarean section      x2  . Thyroidectomy    . Tonsillectomy    . Appendectomy    . Abdominal hysterectomy    . Dilation and curettage of uterus    . Plantar fascia release  2009    left heel   . Colonoscopy    . Mass excision  05/02/2012    Procedure: EXCISION MASS;  Surgeon: Shelly Rubenstein, MD;  Location: [facility];  Service: General;  Laterality: Right;  excision cyst right upper back    Prior to Admission medications   Medication Sig Start Date End Date Taking? Authorizing Provider  amoxicillin-clavulanate (AUGMENTIN) 875-125 MG per tablet Take 1 tablet by mouth 2 (two) times daily. 12/11/12 12/25/12 Yes Shelly Rubenstein, MD  cetirizine (ZYRTEC) 10 MG tablet Take 10 mg by mouth daily as needed for allergies.    Yes Historical Provider, MD  HYDROcodone-acetaminophen (NORCO/VICODIN) 5-325 MG per tablet Take 1 tablet by mouth every 6 (six) hours as needed for pain.   Yes Historical Provider, MD  levothyroxine (SYNTHROID, LEVOTHROID) 100 MCG tablet Take 100 mcg by mouth daily.   Yes Historical Provider, MD    Current Facility-Administered Medications  Medication Dose Route Frequency Provider Last Rate Last Dose  . acetaminophen (TYLENOL) tablet 650 mg  650 mg Oral Q6H PRN Sherrie George, PA-C       Or  . acetaminophen (TYLENOL) suppository 650 mg  650 mg Rectal Q6H PRN Sherrie George, PA-C      . ceFAZolin (ANCEF) IVPB 2 g/50 mL premix  2 g Intravenous On Call to OR Shelly Rubenstein, MD      .  ciprofloxacin (CIPRO) IVPB 400 mg  400 mg Intravenous BID Sherrie George, PA-C   400 mg at 12/13/12 0834  . dextrose 5 % and 0.45 % NaCl with KCl 20 mEq/L infusion   Intravenous Continuous Sherrie George, PA-C 100 mL/hr at 12/13/12 0528    . diphenhydrAMINE (BENADRYL) injection 12.5 mg  12.5 mg Intravenous Q6H PRN Sherrie George, PA-C       Or  . diphenhydrAMINE (BENADRYL) 12.5 MG/5ML elixir 12.5 mg  12.5 mg Oral Q6H PRN Sherrie George, PA-C      . famotidine (PEPCID) IVPB 20 mg  20 mg Intravenous Q12H Sherrie George, PA-C   20 mg at 12/12/12 2113  . heparin injection 5,000 Units  5,000 Units Subcutaneous Q8H Sherrie George, PA-C   5,000 Units at 12/12/12  2113  . levothyroxine (SYNTHROID, LEVOTHROID) tablet 100 mcg  100 mcg Oral QAC breakfast Sherrie George, PA-C      . loratadine (CLARITIN) tablet 10 mg  10 mg Oral Daily Sherrie George, PA-C   10 mg at 12/12/12 1629  . morphine 2 MG/ML injection 1-4 mg  1-4 mg Intravenous Q1H PRN Sherrie George, PA-C   4 mg at 12/12/12 2007  . ondansetron (ZOFRAN) injection 4 mg  4 mg Intravenous Q6H PRN Sherrie George, PA-C   4 mg at 12/12/12 1705  . oxyCODONE (Oxy IR/ROXICODONE) immediate release tablet 5-15 mg  5-15 mg Oral Q4H PRN Sherrie George, PA-C      . promethazine (PHENERGAN) injection 12.5 mg  12.5 mg Intravenous Q4H PRN Mariella Saa, MD   12.5 mg at 12/12/12 2113    Allergies as of 12/12/2012  . (No Known Allergies)    Family History  Problem Relation Age of Onset  . Cancer Mother     breast  . Cancer Maternal Grandmother     breast    History   Social History  . Marital Status: Married    Spouse Name: N/A    Number of Children: N/A  . Years of Education: N/A   Occupational History  . Not on file.   Social History Main Topics  . Smoking status: Never Smoker   . Smokeless tobacco: Never Used  . Alcohol Use: 0.6 oz/week    1 Glasses of wine per week     Comment: 1 glass of wine per night  . Drug Use: No  . Sexual Activity: No   Other Topics Concern  . Not on file   Social History Narrative  . No narrative on file    Review of Systems: Ten point ROS is O/W negative except as mentioned in HPI.  Physical Exam: Vital signs in last 24 hours: Temp:  [98.6 F (37 C)-99.8 F (37.7 C)] 98.6 F (37 C) (09/04 0538) Pulse Rate:  [50-63] 50 (09/04 0538) Resp:  [16-18] 18 (09/04 0538) BP: (99-123)/(41-68) 121/64 mmHg (09/04 0538) SpO2:  [95 %-100 %] 100 % (09/04 0538) Weight:  [145 lb 2.2 oz (65.834 kg)] 145 lb 2.2 oz (65.834 kg) (09/03 1559)   General:   Alert, Well-developed, well-nourished, pleasant and cooperative in NAD Head:  Normocephalic and  atraumatic. Eyes:  Sclera clear, no icterus.  Conjunctiva pink. Ears:  Normal auditory acuity. Mouth:  No deformity or lesions.   Lungs:  Clear throughout to auscultation.  No wheezes, crackles, or rhonchi.  Heart:  Bradycardic but with regular rhythm; no murmurs, clicks, rubs, or gallops. Abdomen:  Soft, non-distended.  BS present.  RUQ TTP without R/R/G. Rectal:  Deferred  Msk:  Symmetrical without gross deformities. Pulses:  Normal pulses noted. Extremities:  Without clubbing or edema. Neurologic:  Alert and  oriented x4;  grossly normal neurologically. Skin:  Intact without significant lesions or rashes. Psych:  Alert and cooperative. Normal mood and affect.  Intake/Output from previous day: 09/03 0701 - 09/04 0700 In: 1191.7 [I.V.:1141.7; IV Piggyback:50] Out: 1600 [Urine:1600]  Lab Results:  Recent Labs  12/12/12 1235 12/13/12 0415  WBC 6.5 5.1  HGB 14.1 12.2  HCT 43.3 37.8  PLT 296 228   BMET  Recent Labs  12/12/12 1235 12/13/12 0415  NA 137 140  K 3.6 4.3  CL 102 108  CO2 27 28  GLUCOSE 103* 104*  BUN 14 10  CREATININE 0.72 0.79  CALCIUM 9.4 8.8   LFT  Recent Labs  12/13/12 0415  PROT 5.7*  ALBUMIN 2.9*  AST 176*  ALT 326*  ALKPHOS 164*  BILITOT 2.3*   Studies/Results: US Abdomen Complete  12/12/2012   *RADIOLOGY REPORT*  Clinical Data:  Cholelithiasis with elevated total bilirubin to 6.1  COMPLETE ABDOMINAL ULTRASOUND  Comparison:  12/04/2012 ultrasound  Findings:  Gallbladder:  With wall echo shadow complex consistent with abundant cholelithiasis.  Wall is difficult to evaluate but appears upper normal at 3 mm.  There is a positive sonographic Murphy's sign.  Common bile duct:  7.4 mm  Liver:  No focal lesion identified.  Within normal limits in parenchymal echogenicity.  IVC:  Appears normal.  Pancreas:  No focal abnormality seen.  Spleen:  Normal at 7.3 cm  Right Kidney:  Normal at 11 cm  Left Kidney:  Normal at 11 cm  Abdominal aorta:  No  aneurysm identified.  IMPRESSION: Abundant cholelithiasis with positive sonographic Murphy's sign. Interval dilatation of the common bile duct when compared to recent prior study.   Original Report Authenticated By: Esperanza Heir, M.D.    IMPRESSION:  -Cholelithiasis with new dilation of CBD and acute elevation of LFT's.  High probability of CBD stones, although LFT's trending down today.   PLAN: -ERCP later this AM.  She is already on cipro.   ZEHR, JESSICA D.  12/13/2012, 8:56 AM  Pager number [phone]     [facility] Attending  I have also seen and assessed the patient and agree with the above note. My hx and PE same. Inages reviewed. i think it is quite likely she has.had choledocholithiasis and thet a pre-op ERCP is sensible. The risks and benefits as well as alternatives of endoscopic procedure(s) have been discussed and reviewed. All questions answered. The patient agrees to proceed.  Iva Boop, MD, [facility] [phone] (pager) 12/13/2012 10:17 AM

## 2012-12-13 NOTE — H&P (Signed)
Patient seen and examined.  Agree with above.  Plan on cholecystectomy this admission.  I have explained the procedure, risks, and aftercare of cholecystectomy.  Risks include but are not limited to bleeding, infection, wound problems, anesthesia, diarrhea, bile leak, injury to common bile duct/liver/intestine.  She seems to understand.

## 2012-12-14 ENCOUNTER — Inpatient Hospital Stay (HOSPITAL_COMMUNITY): Payer: Medicare Other

## 2012-12-14 ENCOUNTER — Inpatient Hospital Stay (HOSPITAL_COMMUNITY): Payer: Medicare Other | Admitting: *Deleted

## 2012-12-14 ENCOUNTER — Encounter (HOSPITAL_COMMUNITY): Admission: EM | Disposition: A | Discharge status: Home / Self Care | Payer: Self-pay | Source: Home / Self Care

## 2012-12-14 ENCOUNTER — Encounter (HOSPITAL_COMMUNITY): Payer: Self-pay | Admitting: *Deleted

## 2012-12-14 ENCOUNTER — Encounter (HOSPITAL_COMMUNITY): Payer: Self-pay | Admitting: Internal Medicine

## 2012-12-14 DIAGNOSIS — K838 Other specified diseases of biliary tract: Secondary | ICD-10-CM | POA: Diagnosis not present

## 2012-12-14 DIAGNOSIS — K8065 Calculus of gallbladder and bile duct with chronic cholecystitis with obstruction: Secondary | ICD-10-CM | POA: Diagnosis not present

## 2012-12-14 DIAGNOSIS — K8010 Calculus of gallbladder with chronic cholecystitis without obstruction: Secondary | ICD-10-CM | POA: Diagnosis not present

## 2012-12-14 DIAGNOSIS — R7989 Other specified abnormal findings of blood chemistry: Secondary | ICD-10-CM | POA: Diagnosis not present

## 2012-12-14 DIAGNOSIS — E890 Postprocedural hypothyroidism: Secondary | ICD-10-CM | POA: Diagnosis not present

## 2012-12-14 DIAGNOSIS — I498 Other specified cardiac arrhythmias: Secondary | ICD-10-CM | POA: Diagnosis not present

## 2012-12-14 DIAGNOSIS — K8020 Calculus of gallbladder without cholecystitis without obstruction: Secondary | ICD-10-CM | POA: Diagnosis not present

## 2012-12-14 DIAGNOSIS — K8021 Calculus of gallbladder without cholecystitis with obstruction: Secondary | ICD-10-CM | POA: Diagnosis not present

## 2012-12-14 DIAGNOSIS — K8050 Calculus of bile duct without cholangitis or cholecystitis without obstruction: Secondary | ICD-10-CM | POA: Diagnosis not present

## 2012-12-14 HISTORY — PX: CHOLECYSTECTOMY: SHX55

## 2012-12-14 LAB — CBC
Hemoglobin: 12.2 g/dL (ref 12.0–15.0)
MCH: 30.8 pg (ref 26.0–34.0)
MCHC: 32.5 g/dL (ref 30.0–36.0)
RDW: 13.2 % (ref 11.5–15.5)

## 2012-12-14 LAB — AMYLASE: Amylase: 40 U/L (ref 0–105)

## 2012-12-14 LAB — LIPASE, BLOOD: Lipase: 30 U/L (ref 11–59)

## 2012-12-14 SURGERY — LAPAROSCOPIC CHOLECYSTECTOMY WITH INTRAOPERATIVE CHOLANGIOGRAM
Anesthesia: General | Site: Abdomen | Wound class: Clean Contaminated

## 2012-12-14 MED ORDER — DEXAMETHASONE SODIUM PHOSPHATE 10 MG/ML IJ SOLN
INTRAMUSCULAR | Status: DC | PRN
  Administered 2012-12-14: 5 mg via INTRAVENOUS

## 2012-12-14 MED ORDER — KCL IN DEXTROSE-NACL 20-5-0.45 MEQ/L-%-% IV SOLN
INTRAVENOUS | Status: AC
  Filled 2012-12-14: qty 1000

## 2012-12-14 MED ORDER — LIDOCAINE HCL (CARDIAC) 20 MG/ML IV SOLN
INTRAVENOUS | Status: DC | PRN
  Administered 2012-12-14: 50 mg via INTRAVENOUS

## 2012-12-14 MED ORDER — PROPOFOL 10 MG/ML IV BOLUS
INTRAVENOUS | Status: DC | PRN
  Administered 2012-12-14: 10 mg via INTRAVENOUS

## 2012-12-14 MED ORDER — IOHEXOL 300 MG/ML  SOLN
INTRAMUSCULAR | Status: DC | PRN
  Administered 2012-12-14: 20 mL

## 2012-12-14 MED ORDER — PHENYLEPHRINE HCL 10 MG/ML IJ SOLN
INTRAMUSCULAR | Status: DC | PRN
  Administered 2012-12-14: 80 ug via INTRAVENOUS
  Administered 2012-12-14 (×3): 40 ug via INTRAVENOUS

## 2012-12-14 MED ORDER — HYDROMORPHONE HCL PF 1 MG/ML IJ SOLN
0.2500 mg | INTRAMUSCULAR | Status: DC | PRN
  Administered 2012-12-14 (×3): 0.5 mg via INTRAVENOUS

## 2012-12-14 MED ORDER — FENTANYL CITRATE 0.05 MG/ML IJ SOLN
INTRAMUSCULAR | Status: DC | PRN
  Administered 2012-12-14 (×4): 50 ug via INTRAVENOUS

## 2012-12-14 MED ORDER — PROMETHAZINE HCL 25 MG/ML IJ SOLN: 6.2500 mg | INTRAMUSCULAR | Status: DC | PRN

## 2012-12-14 MED ORDER — HYDROMORPHONE HCL PF 1 MG/ML IJ SOLN
0.5000 mg | INTRAMUSCULAR | Status: DC | PRN
  Administered 2012-12-14: 0.5 mg via INTRAVENOUS
  Administered 2012-12-14: 1 mg via INTRAVENOUS
  Administered 2012-12-14: 0.5 mg via INTRAVENOUS
  Administered 2012-12-14: 1 mg via INTRAVENOUS
  Administered 2012-12-14: 0.5 mg via INTRAVENOUS
  Administered 2012-12-15 – 2012-12-17 (×11): 1 mg via INTRAVENOUS
  Filled 2012-12-14 (×16): qty 1

## 2012-12-14 MED ORDER — PROMETHAZINE HCL 25 MG/ML IJ SOLN
12.5000 mg | INTRAMUSCULAR | Status: DC | PRN
  Administered 2012-12-14 – 2012-12-15 (×2): 12.5 mg via INTRAVENOUS
  Filled 2012-12-14 (×2): qty 1

## 2012-12-14 MED ORDER — LACTATED RINGERS IV SOLN
INTRAVENOUS | Status: DC
  Administered 2012-12-14: 13:00:00 via INTRAVENOUS
  Administered 2012-12-14: 1000 mL via INTRAVENOUS

## 2012-12-14 MED ORDER — NEOSTIGMINE METHYLSULFATE 1 MG/ML IJ SOLN
INTRAMUSCULAR | Status: DC | PRN
  Administered 2012-12-14: 4 mg via INTRAVENOUS

## 2012-12-14 MED ORDER — ONDANSETRON HCL 4 MG/2ML IJ SOLN
INTRAMUSCULAR | Status: DC | PRN
  Administered 2012-12-14: 4 mg via INTRAVENOUS

## 2012-12-14 MED ORDER — LACTATED RINGERS IR SOLN
Status: DC | PRN
  Administered 2012-12-14: 1000 mL

## 2012-12-14 MED ORDER — PROMETHAZINE HCL 25 MG/ML IJ SOLN: 12.5000 mg | Freq: Four times a day (QID) | INTRAMUSCULAR | Status: DC | PRN

## 2012-12-14 MED ORDER — OXYCODONE HCL 5 MG PO TABS: 5.0000 mg | ORAL_TABLET | Freq: Once | ORAL | Status: DC | PRN

## 2012-12-14 MED ORDER — GLYCOPYRROLATE 0.2 MG/ML IJ SOLN
INTRAMUSCULAR | Status: DC | PRN
  Administered 2012-12-14: .6 mg via INTRAVENOUS
  Administered 2012-12-14: 0.2 mg via INTRAVENOUS

## 2012-12-14 MED ORDER — MEPERIDINE HCL 50 MG/ML IJ SOLN: 6.2500 mg | INTRAMUSCULAR | Status: DC | PRN

## 2012-12-14 MED ORDER — BUPIVACAINE HCL (PF) 0.5 % IJ SOLN
INTRAMUSCULAR | Status: AC
  Filled 2012-12-14: qty 30

## 2012-12-14 MED ORDER — OXYCODONE HCL 5 MG/5ML PO SOLN
5.0000 mg | Freq: Once | ORAL | Status: DC | PRN
  Filled 2012-12-14: qty 5

## 2012-12-14 MED ORDER — HEPARIN SODIUM (PORCINE) 5000 UNIT/ML IJ SOLN
5000.0000 [IU] | Freq: Three times a day (TID) | INTRAMUSCULAR | Status: DC
  Administered 2012-12-15 – 2012-12-18 (×10): 5000 [IU] via SUBCUTANEOUS
  Filled 2012-12-14 (×13): qty 1

## 2012-12-14 MED ORDER — 0.9 % SODIUM CHLORIDE (POUR BTL) OPTIME
TOPICAL | Status: DC | PRN
  Administered 2012-12-14: 1000 mL

## 2012-12-14 MED ORDER — HYDROMORPHONE HCL PF 1 MG/ML IJ SOLN
INTRAMUSCULAR | Status: AC
  Filled 2012-12-14: qty 1

## 2012-12-14 MED ORDER — BUPIVACAINE HCL 0.5 % IJ SOLN
INTRAMUSCULAR | Status: DC | PRN
  Administered 2012-12-14: 5 mL

## 2012-12-14 MED ORDER — MIDAZOLAM HCL 5 MG/5ML IJ SOLN
INTRAMUSCULAR | Status: DC | PRN
  Administered 2012-12-14 (×2): 0.5 mg via INTRAVENOUS
  Administered 2012-12-14: 1 mg via INTRAVENOUS

## 2012-12-14 MED ORDER — ROCURONIUM BROMIDE 100 MG/10ML IV SOLN
INTRAVENOUS | Status: DC | PRN
  Administered 2012-12-14: 40 mg via INTRAVENOUS
  Administered 2012-12-14: 5 mg via INTRAVENOUS

## 2012-12-14 MED ORDER — METOCLOPRAMIDE HCL 5 MG/ML IJ SOLN
INTRAMUSCULAR | Status: DC | PRN
  Administered 2012-12-14: 10 mg via INTRAVENOUS

## 2012-12-14 SURGICAL SUPPLY — 46 items
APPLIER CLIP 5 13 M/L LIGAMAX5 (MISCELLANEOUS) ×2
BENZOIN TINCTURE PRP APPL 2/3 (GAUZE/BANDAGES/DRESSINGS) ×2 IMPLANT
CANISTER SUCTION 2500CC (MISCELLANEOUS) ×2 IMPLANT
CHLORAPREP W/TINT 26ML (MISCELLANEOUS) ×2 IMPLANT
CLIP APPLIE 5 13 M/L LIGAMAX5 (MISCELLANEOUS) ×1 IMPLANT
CLOTH BEACON ORANGE TIMEOUT ST (SAFETY) ×2 IMPLANT
COVER MAYO STAND STRL (DRAPES) IMPLANT
COVER SURGICAL LIGHT HANDLE (MISCELLANEOUS) IMPLANT
DECANTER SPIKE VIAL GLASS SM (MISCELLANEOUS) ×2 IMPLANT
DRAIN CHANNEL 19F RND (DRAIN) ×2 IMPLANT
DRAPE C-ARM 42X120 X-RAY (DRAPES) IMPLANT
DRAPE LAPAROSCOPIC ABDOMINAL (DRAPES) ×2 IMPLANT
DRAPE UTILITY XL STRL (DRAPES) ×2 IMPLANT
DRESSING SURGICEL FIBRLLR 1X2 (HEMOSTASIS) IMPLANT
DRSG SURGICEL FIBRILLAR 1X2 (HEMOSTASIS)
DRSG TEGADERM 2-3/8X2-3/4 SM (GAUZE/BANDAGES/DRESSINGS) ×8 IMPLANT
ELECT REM PT RETURN 9FT ADLT (ELECTROSURGICAL) ×2
ELECTRODE REM PT RTRN 9FT ADLT (ELECTROSURGICAL) ×1 IMPLANT
ENDOLOOP SUT PDS II  0 18 (SUTURE) ×3
ENDOLOOP SUT PDS II 0 18 (SUTURE) ×3 IMPLANT
EVACUATOR SILICONE 100CC (DRAIN) ×2 IMPLANT
GAUZE SPONGE 2X2 8PLY STRL LF (GAUZE/BANDAGES/DRESSINGS) ×1 IMPLANT
GLOVE ECLIPSE 8.0 STRL XLNG CF (GLOVE) ×2 IMPLANT
GLOVE INDICATOR 8.0 STRL GRN (GLOVE) ×4 IMPLANT
GOWN STRL NON-REIN LRG LVL3 (GOWN DISPOSABLE) ×2 IMPLANT
GOWN STRL REIN XL XLG (GOWN DISPOSABLE) ×6 IMPLANT
HEMOSTAT SURGICEL 4X8 (HEMOSTASIS) ×2 IMPLANT
IV CATH 14GX2 1/4 (CATHETERS) IMPLANT
KIT BASIN OR (CUSTOM PROCEDURE TRAY) ×2 IMPLANT
NS IRRIG 1000ML POUR BTL (IV SOLUTION) IMPLANT
POUCH SPECIMEN RETRIEVAL 10MM (ENDOMECHANICALS) ×2 IMPLANT
SCISSORS LAP 5X45 EPIX DISP (ENDOMECHANICALS) ×2 IMPLANT
SET CHOLANGIOGRAPH MIX (MISCELLANEOUS) ×2 IMPLANT
SET IRRIG TUBING LAPAROSCOPIC (IRRIGATION / IRRIGATOR) ×4 IMPLANT
SLEEVE XCEL OPT CAN 5 100 (ENDOMECHANICALS) ×4 IMPLANT
SOLUTION ANTI FOG 6CC (MISCELLANEOUS) ×2 IMPLANT
SPONGE GAUZE 2X2 STER 10/PKG (GAUZE/BANDAGES/DRESSINGS) ×1
STRIP CLOSURE SKIN 1/2X4 (GAUZE/BANDAGES/DRESSINGS) ×2 IMPLANT
SUT ETHILON 3 0 PS 1 (SUTURE) ×2 IMPLANT
SUT MNCRL AB 4-0 PS2 18 (SUTURE) ×2 IMPLANT
TOWEL OR 17X26 10 PK STRL BLUE (TOWEL DISPOSABLE) ×2 IMPLANT
TRAY LAP CHOLE (CUSTOM PROCEDURE TRAY) ×2 IMPLANT
TROCAR BLADELESS OPT 5 100 (ENDOMECHANICALS) ×6 IMPLANT
TROCAR XCEL BLUNT TIP 100MML (ENDOMECHANICALS) ×2 IMPLANT
TROCAR XCEL NON-BLD 11X100MML (ENDOMECHANICALS) IMPLANT
TUBING INSUFFLATION 10FT LAP (TUBING) ×2 IMPLANT

## 2012-12-14 NOTE — Progress Notes (Signed)
IOC reviewed - she may now have a stone lodges in distal CBD. Will recheck her tomorrow and have made her NPO in AM - may need another ERCP depending upon how she does (sxs, labs). Duct was not obstructed pre-op and I believe opacity seen was between cystic duct and CBD and was gas from intestine. Balloon sweeps and stones negative, sphincterotomy was w/o obvious problems and I saw  no evidence for complications or problems as suggested by radiologist.  Will see how she does

## 2012-12-14 NOTE — Op Note (Signed)
Preoperative diagnosis:  Cholelithiasis with choledocholithiasis  Postoperative diagnosis:  same  Procedure: Laparoscopic cholecystectomy with cholangiogram.  Surgeon: Avel Peace, M.D.  Asst.:  None  Anesthesia: General  Indication:   This is a 72 year old female with symptomatic cholelithiasis who had an elective cholecystectomy scheduled for next week.  She had increasing symptoms had was admitted with a dilated CBD and elevated LFTS.  An ERCP with sphincterotomy showed a dilated CBD but not definite stones.  She is now brought to the operating room for the above procedure.  Technique: She was brought to the operating room, placed supine on the operating table, and a general anesthetic was administered. The abdominal wall was then sterilely prepped and draped. Local anesthetic (Marcaine) was infiltrated in the subumbilical region. A small subumbilical incision was made through the skin, subcutaneous tissue, fascia, and peritoneum entering the peritoneal cavity under direct vision.  There were adhesions between the omentum and anterior abdominal wall that were mobilized.  A pursestring suture of 0 Vicryl was placed around the edges of the fascia. A Hassan trocar was introduced into the peritoneal cavity and a pneumoperitoneum was created by insufflation of carbon dioxide gas. The laparoscope was introduced into the trocar and no underlying bleeding or organ injury was noted. The patient was then placed in the reverse Trendelenburg position with the right side tilted slightly up.  Three 5 mm trocars were then placed into the abdominal cavity under laparoscopic vision. One in the epigastric area, and 2 in the right upper quadrant area. The gallbladder was visualized and the fundus was grasped and retracted toward the right shoulder.   Adhesions between gallbladder and omentum were mobilized bluntly. The infundibulum was mobilized with dissection close to the gallbladder and retracted laterally. A  dilated cystic duct was identified and a window was created around it. The cystic artery was also identified and a window was created around it.  The artery was clipped and divided. The critical view was achieved. A clip was placed at the neck of the gallbladder. A small incision was made in the cystic duct.  Bilious fluid under pressure and small soft stones came out of the cystic duct.  A cholangiocatheter was introduced through the anterior abdominal wall and placed in the cystic duct. A intraoperative cholangiogram was then performed.  Under real-time fluoroscopy, dilute contrast was injected into the cystic duct.  The common hepatic duct, the right and left hepatic ducts, and the common duct were all visualized, but contrast did not drain into the duodenum.  The cholangiocatheter was removed, the cystic duct was clipped once and two endoloops were placed on the biliary side, and then the cystic duct was divided sharply. No bile leak was noted from the cystic duct stump.   Following this the gallbladder was dissected free from the liver using electrocautery. The gallbladder was then placed in a retrieval bag and removed from the abdominal cavity through the subumbilical incision.  The gallbladder fossa was inspected, irrigated, and bleeding was controlled with electrocautery. Inspection showed that hemostasis was adequate and there was no evidence of bile leak.  The irrigation fluid was evacuated as much as possible.  Because of the non-draining CBD and dilated cystic duct.  I decided to place a #19 Blake drain into the gallbladder fossa and pull part of the drain out the lateral RUQ trocar.  It was anchored to the skin with a nylon suture.  The subumbilical trocar was removed and the fascial defect was closed by  tightening and tying down the pursestring suture under laparoscopic vision.  The remaining trocars were removed and the pneumoperitoneum was released. The skin incisions were closed with 4-0  Monocryl subcuticular stitches. Steri-Strips and sterile dressings were applied.  The procedure was well-tolerated without any apparent complications. The patient was taken to the recovery room in satisfactory condition.  I discussed the IOC findings with Dr. Leone Payor.  Will check LFTs in the AM.

## 2012-12-14 NOTE — Anesthesia Postprocedure Evaluation (Signed)
  Anesthesia Post-op Note  Patient: Lori Webb  Procedure(s) Performed: Procedure(s) (LRB): LAPAROSCOPIC CHOLECYSTECTOMY WITH INTRAOPERATIVE CHOLANGIOGRAM (N/A)  Patient Location: PACU  Anesthesia Type: General  Level of Consciousness: awake and alert   Airway and Oxygen Therapy: Patient Spontanous Breathing  Post-op Pain: mild  Post-op Assessment: Post-op Vital signs reviewed, Patient's Cardiovascular Status Stable, Respiratory Function Stable, Patent Airway and No signs of Nausea or vomiting  Last Vitals:  Filed Vitals:   12/14/12 1445  BP: 126/44  Pulse: 57  Temp:   Resp: 10    Post-op Vital Signs: stable   Complications: No apparent anesthesia complications

## 2012-12-14 NOTE — Anesthesia Preprocedure Evaluation (Addendum)
Anesthesia Evaluation  Patient identified by MRN, date of birth, ID band Patient awake    Reviewed: Allergy & Precautions, H&P , NPO status , Patient's Chart, lab work & pertinent test results  Airway       Dental  (+) Dental Advisory Given   Pulmonary neg pulmonary ROS,          Cardiovascular negative cardio ROS      Neuro/Psych PSYCHIATRIC DISORDERS Anxiety negative neurological ROS     GI/Hepatic negative GI ROS, Neg liver ROS,   Endo/Other  Hypothyroidism   Renal/GU negative Renal ROS     Musculoskeletal negative musculoskeletal ROS (+)   Abdominal   Peds  Hematology negative hematology ROS (+)   Anesthesia Other Findings   Reproductive/Obstetrics negative OB ROS                          Anesthesia Physical  Anesthesia Plan  ASA: II  Anesthesia Plan: General   Post-op Pain Management:    Induction: Intravenous  Airway Management Planned:   Additional Equipment:   Intra-op Plan:   Post-operative Plan: Extubation in OR  Informed Consent: I have reviewed the patients History and Physical, chart, labs and discussed the procedure including the risks, benefits and alternatives for the proposed anesthesia with the patient or authorized representative who has indicated his/her understanding and acceptance.   Dental advisory given  Plan Discussed with: CRNA  Anesthesia Plan Comments:         Anesthesia Quick Evaluation

## 2012-12-14 NOTE — Progress Notes (Signed)
Lap Chole today.

## 2012-12-14 NOTE — Transfer of Care (Signed)
Immediate Anesthesia Transfer of Care Note  Patient: Lori Webb  Procedure(s) Performed: Procedure(s): LAPAROSCOPIC CHOLECYSTECTOMY WITH INTRAOPERATIVE CHOLANGIOGRAM (N/A)  Patient Location: PACU  Anesthesia Type:General  Level of Consciousness: awake, alert , oriented and patient cooperative  Airway & Oxygen Therapy: Patient Spontanous Breathing and Patient connected to face mask oxygen  Post-op Assessment: Report given to PACU RN, Post -op Vital signs reviewed and stable and Patient moving all extremities X 4  Post vital signs: Reviewed and stable  Complications: No apparent anesthesia complications

## 2012-12-14 NOTE — Progress Notes (Signed)
Patient ID: Lori Webb, female   DOB: [birth year], 72 y.o.   MRN: [id number] 1 Day Post-Op  Subjective: No major complaints, a little abd pain but not severe, denies n/v, has headache after receiving morphine  Objective: Vital signs in last 24 hours: Temp:  [97.5 F (36.4 C)-98.7 F (37.1 C)] 98.1 F (36.7 C) (09/05 0639) Pulse Rate:  [43-65] 47 (09/05 0639) Resp:  [13-55] 16 (09/05 0639) BP: (106-157)/(42-78) 106/58 mmHg (09/05 0639) SpO2:  [96 %-100 %] 96 % (09/05 0639)    Intake/Output from previous day: 09/04 0701 - 09/05 0700 In: 2400 [I.V.:2400] Out: 3000 [Urine:3000] Intake/Output this shift:    PE: Abd: mildly tender in RUQ, soft, +BS General: NAD Heart: RRR Lungs: CTA bil  Lab Results:   Recent Labs  12/13/12 0415 12/14/12 0419  WBC 5.1 4.9  HGB 12.2 12.2  HCT 37.8 37.5  PLT 228 221   BMET  Recent Labs  12/12/12 1235 12/13/12 0415  NA 137 140  K 3.6 4.3  CL 102 108  CO2 27 28  GLUCOSE 103* 104*  BUN 14 10  CREATININE 0.72 0.79  CALCIUM 9.4 8.8   PT/INR No results found for this basename: LABPROT, INR,  in the last 72 hours CMP     Component Value Date/Time   NA 140 12/13/2012 0415   K 4.3 12/13/2012 0415   CL 108 12/13/2012 0415   CO2 28 12/13/2012 0415   GLUCOSE 104* 12/13/2012 0415   BUN 10 12/13/2012 0415   CREATININE 0.79 12/13/2012 0415   CALCIUM 8.8 12/13/2012 0415   PROT 5.7* 12/13/2012 0415   ALBUMIN 2.9* 12/13/2012 0415   AST 176* 12/13/2012 0415   ALT 326* 12/13/2012 0415   ALKPHOS 164* 12/13/2012 0415   BILITOT 2.3* 12/13/2012 0415   GFRNONAA 81* 12/13/2012 0415   GFRAA >90 12/13/2012 0415   Lipase     Component Value Date/Time   LIPASE 30 12/14/2012 0419       Studies/Results: US Abdomen Complete  12/12/2012   *RADIOLOGY REPORT*  Clinical Data:  Cholelithiasis with elevated total bilirubin to 6.1  COMPLETE ABDOMINAL ULTRASOUND  Comparison:  12/04/2012 ultrasound  Findings:  Gallbladder:  With wall echo shadow complex consistent with  abundant cholelithiasis.  Wall is difficult to evaluate but appears upper normal at 3 mm.  There is a positive sonographic Murphy's sign.  Common bile duct:  7.4 mm  Liver:  No focal lesion identified.  Within normal limits in parenchymal echogenicity.  IVC:  Appears normal.  Pancreas:  No focal abnormality seen.  Spleen:  Normal at 7.3 cm  Right Kidney:  Normal at 11 cm  Left Kidney:  Normal at 11 cm  Abdominal aorta:  No aneurysm identified.  IMPRESSION: Abundant cholelithiasis with positive sonographic Murphy's sign. Interval dilatation of the common bile duct when compared to recent prior study.   Original Report Authenticated By: Esperanza Heir, M.D.   Dg Ercp Biliary & Pancreatic Ducts  12/13/2012   *RADIOLOGY REPORT*  Clinical Data: ERCP with sphincterotomy  ERCP  Comparison: Abdominal ultrasound - 12/12/2012; CT abdomen pelvis - 11/21/2012  Findings:  10 spot intraoperative fluoroscopic images used during ERCP are provided for review.  Images demonstrate an ERCP probe overlying the right upper abdominal quadrant.  There is selective cannulation and opacification of the common bile duct.  Common bile duct appears mildly dilated.  There are two discrete punctate filling defects with the distal aspect of the common bile duct which may  represent air bubbles verses gallstones (series 2)  Subsequent images demonstrated apparent sweeping of the distal common bile duct with a sphincterotomy balloon.  There is minimal opacification of the central aspect of the cystic duct.  Completion image demonstrates an apparent focal outpouching within the medial aspect of the distal common bile duct and may be the sequela of iatrogenic injury versus a low insertion of the cystic duct, suboptimally opacified.  There is no opacification of the pancreatic duct.  There is minimal opacification of the central aspect of the intrahepatic biliary tree which appears mildly dilated.  IMPRESSION: 1.  ERCP with balloon sphincterotomy. 2.   Completion images demonstrate apparent focal outpouching involving the medial aspect of the distal common bile duct which may represent the sequela of iatrogenic injury during sphincterotomy versus a suboptimally opacified low insertion of the cystic duct.  Correlation with endoscopy report is recommended. Further evaluation with MRCP may be performed as indicated.   Original Report Authenticated By: Tacey Ruiz, MD    Anti-infectives: Anti-infectives   Start     Dose/Rate Route Frequency Ordered Stop   12/13/12 0600  ceFAZolin (ANCEF) IVPB 2 g/50 mL premix  Status:  Discontinued     2 g 100 mL/hr over 30 Minutes Intravenous On call to O.R. 12/12/12 1618 12/13/12 1319   12/12/12 1600  ciprofloxacin (CIPRO) IVPB 400 mg     400 mg 200 mL/hr over 60 Minutes Intravenous 2 times daily 12/12/12 1422         Assessment/Plan Cholelithiasis with dilated CBD: s/p ERCP yesterday which showed some filling defects but not obstructed, will proceed with lap chole with IOC today, discussed with patient who understands and wishes to proceed.     LOS: 2 days    Dodd Schmid 12/14/2012

## 2012-12-14 NOTE — Progress Notes (Signed)
[facility] Progress Note  Subjective:  Has a headache.  Still with RUQ abdominal pain that goes into her her right side and around the right side of her back.  Objective:  Vital signs in last 24 hours: Temp:  [97.5 F (36.4 C)-98.7 F (37.1 C)] 98.1 F (36.7 C) (09/05 0639) Pulse Rate:  [43-65] 47 (09/05 0639) Resp:  [13-55] 16 (09/05 0639) BP: (106-157)/(42-78) 106/58 mmHg (09/05 0639) SpO2:  [96 %-100 %] 96 % (09/05 0639)   General:   Alert, Well-developed, in NAD; appears slightly uncomfortable Heart:  Bradycardic but regular rhythm; no murmurs Pulm:  CTAB.  No W/R/R. Abdomen:  Soft, non-distended.  BS present.  RUQ and epigastric TTP without R/R/G. Extremities:  Without edema. Neurologic:  Alert and  oriented x4;  grossly normal neurologically. Psych:  Alert and cooperative. Normal mood and affect.  Intake/Output from previous day: 09/04 0701 - 09/05 0700 In: 2400 [I.V.:2400] Out: 3000 [Urine:3000]  Lab Results:  Recent Labs  12/12/12 1235 12/13/12 0415 12/14/12 0419  WBC 6.5 5.1 4.9  HGB 14.1 12.2 12.2  HCT 43.3 37.8 37.5  PLT 296 228 221   BMET  Recent Labs  12/12/12 1235 12/13/12 0415  NA 137 140  K 3.6 4.3  CL 102 108  CO2 27 28  GLUCOSE 103* 104*  BUN 14 10  CREATININE 0.72 0.79  CALCIUM 9.4 8.8   LFT  Recent Labs  12/13/12 0415  PROT 5.7*  ALBUMIN 2.9*  AST 176*  ALT 326*  ALKPHOS 164*  BILITOT 2.3*     Assessment / Plan: -Cholelithiasis with new dilation of CBD and acute elevation of LFT's.  S/p ERCP yesterday with no stones on balloon sweep or final cholangiogram.  Suspect that she passed stones.  For lap chole today.    LOS: 2 days   ZEHR, JESSICA D.  12/14/2012, 9:20 AM  Pager number [phone]   Agree w/ Ms. Zehr's note. Please call us if ?'s.  Iva Boop, MD, [facility] [phone] (pager) 12/14/2012 11:26 AM

## 2012-12-15 DIAGNOSIS — K8050 Calculus of bile duct without cholangitis or cholecystitis without obstruction: Secondary | ICD-10-CM | POA: Diagnosis not present

## 2012-12-15 LAB — COMPREHENSIVE METABOLIC PANEL
Albumin: 2.8 g/dL — ABNORMAL LOW (ref 3.5–5.2)
BUN: 6 mg/dL (ref 6–23)
Calcium: 8.6 mg/dL (ref 8.4–10.5)
GFR calc Af Amer: >90 mL/min (ref >90)
Glucose, Bld: 114 mg/dL — ABNORMAL HIGH (ref 70–99)
Potassium: 4.2 mEq/L (ref 3.5–5.1)
Sodium: 138 mEq/L (ref 135–145)
Total Protein: 5.6 g/dL — ABNORMAL LOW (ref 6.0–8.3)

## 2012-12-15 NOTE — Progress Notes (Signed)
[facility] Progress Note  Patient Name: Lori Webb Date of Encounter: 12/15/2012, 7:39 AM    Subjective  Sore diffusely and at incisions in abdomen. No back, interscapular pain.   Objective    Physical Exam: Filed Vitals:   12/15/12 0600  BP: 115/54  Pulse: 53  Temp: 98.5 F (36.9 C)  Resp: 18   General: NAD Abdomen: soft, appropriately tender post-op     Intake/Output Summary (Last 24 hours) at 12/15/12 0739 Last data filed at 12/15/12 0500  Gross per 24 hour  Intake   3750 ml  Output   1515 ml  Net   2235 ml    Labs:  Recent Labs  12/13/12 0415 12/15/12 0520  NA 140 138  K 4.3 4.2  CL 108 104  CO2 28 28  GLUCOSE 104* 114*  BUN 10 6  CREATININE 0.79 0.76  CALCIUM 8.8 8.6    Recent Labs  12/13/12 0415 12/15/12 0520  AST 176* 165*  ALT 326* 285*  ALKPHOS 164* 198*  BILITOT 2.3* 3.7*  PROT 5.7* 5.6*  ALBUMIN 2.9* 2.8*    Recent Labs  12/12/12 1235 12/14/12 0419  LIPASE 25 30  AMYLASE  --  40    Recent Labs  12/12/12 1235 12/13/12 0415 12/14/12 0419  WBC 6.5 5.1 4.9  NEUTROABS 4.7  --   --   HGB 14.1 12.2 12.2  HCT 43.3 37.8 37.5  MCV 94.1 95.0 94.7  PLT 296 228 221      Radiology/Studies:  Dg Cholangiogram Operative  12/14/2012   *RADIOLOGY REPORT*  Clinical Data: Choledocholithiasis  INTRAOPERATIVE CHOLANGIOGRAM  Technique:  Multiple fluoroscopic spot radiographs were obtained during intraoperative cholangiogram and are submitted for interpretation post-operatively.  Comparison: ERCP from the previous day  Findings: There is a meniscus in the distal common duct with no passage of contrast distally into the duodenum.  The intrahepatic ducts are incompletely opacified, appearing decompressed centrally, somewhat irregular in contour.  IMPRESSION  1.  Obstructing process in the distal common duct, probably retained obstructing calculi given the more normal appearance just yesterday.   Original Report Authenticated By:  D. Andria Rhein, MD    Dg Ercp Biliary & Pancreatic Ducts  12/13/2012   *RADIOLOGY REPORT*  Clinical Data: ERCP with sphincterotomy  ERCP  Comparison: Abdominal ultrasound - 12/12/2012; CT abdomen pelvis - 11/21/2012  Findings:  10 spot intraoperative fluoroscopic images used during ERCP are provided for review.  Images demonstrate an ERCP probe overlying the right upper abdominal quadrant.  There is selective cannulation and opacification of the common bile duct.  Common bile duct appears mildly dilated.  There are two discrete punctate filling defects with the distal aspect of the common bile duct which may represent air bubbles verses gallstones (series 2)  Subsequent images demonstrated apparent sweeping of the distal common bile duct with a sphincterotomy balloon.  There is minimal opacification of the central aspect of the cystic duct.  Completion image demonstrates an apparent focal outpouching within the medial aspect of the distal common bile duct and may be the sequela of iatrogenic injury versus a low insertion of the cystic duct, suboptimally opacified.  There is no opacification of the pancreatic duct.  There is minimal opacification of the central aspect of the intrahepatic biliary tree which appears mildly dilated.  IMPRESSION: 1.  ERCP with balloon sphincterotomy. 2.  Completion images demonstrate apparent focal outpouching involving the medial aspect of the distal common bile duct which may represent the sequela  of iatrogenic injury during sphincterotomy versus a suboptimally opacified low insertion of the cystic duct.  Correlation with endoscopy report is recommended. Further evaluation with MRCP may be performed as indicated.   Original Report Authenticated By: Tacey Ruiz, MD     Assessment and Plan  1) Choledocholithiasis - IOC shows this - looks like she had stones drop into CBD between ERCP and lap chole. Bili, LFT's up a bit but no pain like she had w/ suspected CBD stones over past  few days. She did not have choledocholithiasis at ERCP by balloon sweeps, etc - 2 tiny filling defects were there but no stones on sweeps and wide easy biliary sphincterotomy. I disagree w/ radiology interpretations about possible iatrogenic problems with sphincterotomy and think she has a low cystic duct insertion with a very eccentric course and an artifact that was created by bowel gas. See my ERCP report.  We have discused it and since she does not have sxs she has had w/ suspected CBD stones will watch her and see how she and labs are tomorrow - if bili increasing and she has more pain will go for ERCP tomorrow. If not will follow and reserve that for persistent problems. I know she had a good sphincterotomy and expect she will pass or has passed any stones there especially after edema subsides. We will see, however.  Iva Boop, MD, [facility] [phone] (pager) 12/15/2012 7:46 AM

## 2012-12-15 NOTE — Progress Notes (Signed)
General Surgery Note  LOS: 3 days  POD -  1 Day Post-Op  Assessment/Plan: 1.  LAPAROSCOPIC CHOLECYSTECTOMY WITH INTRAOPERATIVE CHOLANGIOGRAM - 12/14/2012 - [facility]  On cipro.  Remains nauseated.  Diet advanced.  Repeat labs tomorrow and see how she is doing - whether to repeat the ERCP or not.  The patient understands the plan, though she is not happy about it.  2.  Probable continued common bile duct stones   ERCP/sphincterotomy by Dr. Leone Payor - 12/13/2012 - saw no stones.  T. Bili - 3.7 - 12/15/2012  Repeat labs tomorrow to see where her LFT's are   3.  DVT prophylaxis - SQ Heparin 4.  History of diverticulosis 5.  History of anxiety  Subjective:  Unhappy patient.  Says that her urine is getting dark again. Continues to be mildly nauseated.  She has not had a BM since admission.  But does not feel like she has to have one. Objective:   Filed Vitals:   12/15/12 0600  BP: 115/54  Pulse: 53  Temp: 98.5 F (36.9 C)  Resp: 18     Intake/Output from previous day:  09/05 0701 - 09/06 0700 In: 3750 [I.V.:3200; IV Piggyback:550] Out: 1515 [Urine:1450; Drains:50; Blood:15]  Intake/Output this shift:  Total I/O In: -  Out: 845 [Urine:800; Drains:45]   Physical Exam:   General: Jaundiced WF who is alert and oriented.    HEENT: Normal. Pupils equal. .   Lungs: Clear   Abdomen: Soft.  Few BS   Wound: Okay.  Has RUQ drain - 50 cc yesterday.   Neurologic:  Grossly intact to motor and sensory function.   Psychiatric: Has normal mood and affect.   Lab Results:    Recent Labs  12/13/12 0415 12/14/12 0419  WBC 5.1 4.9  HGB 12.2 12.2  HCT 37.8 37.5  PLT 228 221    BMET   Recent Labs  12/13/12 0415 12/15/12 0520  NA 140 138  K 4.3 4.2  CL 108 104  CO2 28 28  GLUCOSE 104* 114*  BUN 10 6  CREATININE 0.79 0.76  CALCIUM 8.8 8.6    PT/INR  No results found for this basename: LABPROT, INR,  in the last 72 hours  ABG  No results found for this basename: PHART, PCO2,  PO2, HCO3,  in the last 72 hours   Studies/Results:  Dg Cholangiogram Operative  12/14/2012   *RADIOLOGY REPORT*  Clinical Data: Choledocholithiasis  INTRAOPERATIVE CHOLANGIOGRAM  Technique:  Multiple fluoroscopic spot radiographs were obtained during intraoperative cholangiogram and are submitted for interpretation post-operatively.  Comparison: ERCP from the previous day  Findings: There is a meniscus in the distal common duct with no passage of contrast distally into the duodenum.  The intrahepatic ducts are incompletely opacified, appearing decompressed centrally, somewhat irregular in contour.  IMPRESSION  1.  Obstructing process in the distal common duct, probably retained obstructing calculi given the more normal appearance just yesterday.   Original Report Authenticated By: D. Andria Rhein, MD   Dg Ercp Biliary & Pancreatic Ducts  12/13/2012   *RADIOLOGY REPORT*  Clinical Data: ERCP with sphincterotomy  ERCP  Comparison: Abdominal ultrasound - 12/12/2012; CT abdomen pelvis - 11/21/2012  Findings:  10 spot intraoperative fluoroscopic images used during ERCP are provided for review.  Images demonstrate an ERCP probe overlying the right upper abdominal quadrant.  There is selective cannulation and opacification of the common bile duct.  Common bile duct appears mildly dilated.  There are two discrete punctate  filling defects with the distal aspect of the common bile duct which may represent air bubbles verses gallstones (series 2)  Subsequent images demonstrated apparent sweeping of the distal common bile duct with a sphincterotomy balloon.  There is minimal opacification of the central aspect of the cystic duct.  Completion image demonstrates an apparent focal outpouching within the medial aspect of the distal common bile duct and may be the sequela of iatrogenic injury versus a low insertion of the cystic duct, suboptimally opacified.  There is no opacification of the pancreatic duct.  There is minimal  opacification of the central aspect of the intrahepatic biliary tree which appears mildly dilated.  IMPRESSION: 1.  ERCP with balloon sphincterotomy. 2.  Completion images demonstrate apparent focal outpouching involving the medial aspect of the distal common bile duct which may represent the sequela of iatrogenic injury during sphincterotomy versus a suboptimally opacified low insertion of the cystic duct.  Correlation with endoscopy report is recommended. Further evaluation with MRCP may be performed as indicated.   Original Report Authenticated By: Tacey Ruiz, MD     Anti-infectives:   Anti-infectives   Start     Dose/Rate Route Frequency Ordered Stop   12/13/12 0600  ceFAZolin (ANCEF) IVPB 2 g/50 mL premix  Status:  Discontinued     2 g 100 mL/hr over 30 Minutes Intravenous On call to O.R. 12/12/12 1618 12/13/12 1319   12/12/12 1600  ciprofloxacin (CIPRO) IVPB 400 mg     400 mg 200 mL/hr over 60 Minutes Intravenous 2 times daily 12/12/12 1422        Ovidio Kin, MD, FACS Pager: [phone],   [facility] Office: [phone] 12/15/2012

## 2012-12-16 ENCOUNTER — Encounter (HOSPITAL_COMMUNITY): Payer: Self-pay | Admitting: Anesthesiology

## 2012-12-16 ENCOUNTER — Encounter (HOSPITAL_COMMUNITY): Admission: EM | Disposition: A | Discharge status: Home / Self Care | Payer: Self-pay | Source: Home / Self Care

## 2012-12-16 ENCOUNTER — Inpatient Hospital Stay (HOSPITAL_COMMUNITY): Payer: Medicare Other | Admitting: Anesthesiology

## 2012-12-16 ENCOUNTER — Inpatient Hospital Stay (HOSPITAL_COMMUNITY): Payer: Medicare Other

## 2012-12-16 DIAGNOSIS — R7989 Other specified abnormal findings of blood chemistry: Secondary | ICD-10-CM | POA: Diagnosis not present

## 2012-12-16 DIAGNOSIS — K8050 Calculus of bile duct without cholangitis or cholecystitis without obstruction: Secondary | ICD-10-CM | POA: Diagnosis not present

## 2012-12-16 DIAGNOSIS — K5730 Diverticulosis of large intestine without perforation or abscess without bleeding: Secondary | ICD-10-CM | POA: Diagnosis not present

## 2012-12-16 DIAGNOSIS — K8020 Calculus of gallbladder without cholecystitis without obstruction: Secondary | ICD-10-CM | POA: Diagnosis not present

## 2012-12-16 DIAGNOSIS — I498 Other specified cardiac arrhythmias: Secondary | ICD-10-CM | POA: Diagnosis not present

## 2012-12-16 DIAGNOSIS — E890 Postprocedural hypothyroidism: Secondary | ICD-10-CM | POA: Diagnosis not present

## 2012-12-16 DIAGNOSIS — K8065 Calculus of gallbladder and bile duct with chronic cholecystitis with obstruction: Secondary | ICD-10-CM | POA: Diagnosis not present

## 2012-12-16 HISTORY — PX: ERCP: SHX5425

## 2012-12-16 LAB — COMPREHENSIVE METABOLIC PANEL
ALT: 245 U/L — ABNORMAL HIGH (ref 0–35)
AST: 99 U/L — ABNORMAL HIGH (ref 0–37)
Alkaline Phosphatase: 246 U/L — ABNORMAL HIGH (ref 39–117)
CO2: 27 mEq/L (ref 19–32)
Chloride: 108 mEq/L (ref 96–112)
GFR calc Af Amer: >90 mL/min (ref >90)
GFR calc non Af Amer: 82 mL/min — ABNORMAL LOW (ref >90)
Glucose, Bld: 116 mg/dL — ABNORMAL HIGH (ref 70–99)
Sodium: 141 mEq/L (ref 135–145)
Total Bilirubin: 5.5 mg/dL — ABNORMAL HIGH (ref 0.3–1.2)

## 2012-12-16 SURGERY — ERCP, WITH INTERVENTION IF INDICATED
Anesthesia: General

## 2012-12-16 MED ORDER — INDOMETHACIN 50 MG RE SUPP
100.0000 mg | Freq: Once | RECTAL | Status: AC
  Administered 2012-12-16: 100 mg via RECTAL
  Filled 2012-12-16: qty 2

## 2012-12-16 MED ORDER — LIDOCAINE HCL (CARDIAC) 20 MG/ML IV SOLN
INTRAVENOUS | Status: DC | PRN
  Administered 2012-12-16: 40 mg via INTRAVENOUS

## 2012-12-16 MED ORDER — KCL IN DEXTROSE-NACL 20-5-0.45 MEQ/L-%-% IV SOLN
INTRAVENOUS | Status: AC
  Filled 2012-12-16: qty 1000

## 2012-12-16 MED ORDER — ONDANSETRON HCL 4 MG/2ML IJ SOLN
INTRAMUSCULAR | Status: DC | PRN
  Administered 2012-12-16: 4 mg via INTRAVENOUS

## 2012-12-16 MED ORDER — FENTANYL CITRATE 0.05 MG/ML IJ SOLN
INTRAMUSCULAR | Status: DC | PRN
  Administered 2012-12-16 (×2): 25 ug via INTRAVENOUS
  Administered 2012-12-16: 50 ug via INTRAVENOUS

## 2012-12-16 MED ORDER — SODIUM CHLORIDE 0.9 % IJ SOLN
INTRAMUSCULAR | Status: AC
  Filled 2012-12-16: qty 50

## 2012-12-16 MED ORDER — FENTANYL CITRATE 0.05 MG/ML IJ SOLN: 25.0000 ug | INTRAMUSCULAR | Status: DC | PRN

## 2012-12-16 MED ORDER — PROPOFOL 10 MG/ML IV BOLUS
INTRAVENOUS | Status: DC | PRN
  Administered 2012-12-16: 120 mg via INTRAVENOUS

## 2012-12-16 MED ORDER — LACTATED RINGERS IV SOLN
INTRAVENOUS | Status: DC | PRN
  Administered 2012-12-16: 14:00:00 via INTRAVENOUS

## 2012-12-16 MED ORDER — IOHEXOL 300 MG/ML  SOLN
INTRAMUSCULAR | Status: DC | PRN
  Administered 2012-12-16: 30 mL via INTRAVENOUS

## 2012-12-16 MED ORDER — SODIUM CHLORIDE 0.9 % IV SOLN: INTRAVENOUS | Status: DC

## 2012-12-16 MED ORDER — SUCCINYLCHOLINE CHLORIDE 20 MG/ML IJ SOLN
INTRAMUSCULAR | Status: DC | PRN
  Administered 2012-12-16: 100 mg via INTRAVENOUS

## 2012-12-16 MED ORDER — HYDROMORPHONE HCL PF 1 MG/ML IJ SOLN: 0.2500 mg | INTRAMUSCULAR | Status: DC | PRN

## 2012-12-16 MED ORDER — LORAZEPAM 2 MG/ML IJ SOLN
1.0000 mg | Freq: Four times a day (QID) | INTRAMUSCULAR | Status: DC | PRN
  Administered 2012-12-16 – 2012-12-18 (×2): 1 mg via INTRAVENOUS
  Filled 2012-12-16 (×2): qty 1

## 2012-12-16 MED ORDER — PROMETHAZINE HCL 25 MG/ML IJ SOLN: 6.2500 mg | INTRAMUSCULAR | Status: DC | PRN

## 2012-12-16 NOTE — Progress Notes (Signed)
[facility] Progress Note  Subjective:  Says that she has a lot pain and nausea.  Is anxious but IV ativan has been ordered by Dr. Ezzard Standing.  Objective:  Vital signs in last 24 hours: Temp:  [98.1 F (36.7 C)-99.1 F (37.3 C)] 98.2 F (36.8 C) (09/07 0500) Pulse Rate:  [56-64] 63 (09/07 0500) Resp:  [18] 18 (09/07 0500) BP: (104-145)/(47-65) 143/60 mmHg (09/07 0500) SpO2:  [94 %-98 %] 98 % (09/07 0500)   General:   Alert, Well-developed; very uncomfortable and anxious.  Tearful. Heart:  Regular rate and rhythm; no murmurs Pulm:  CTAB.  No W/R/R. Abdomen:  Soft, non-distended.  BS present.  TTP light palpation.  Extremities:  Without edema. Neurologic:  Alert and  oriented x4;  grossly normal neurologically. Psych:  Alert and cooperative.  Tearful and anxious.  Intake/Output from previous day: 09/06 0701 - 09/07 0700 In: 3600 [I.V.:3600] Out: 4105 [Urine:4000; Drains:105] Intake/Output this shift: Total I/O In: -  Out: 350 [Urine:350]  Lab Results:  Recent Labs  12/14/12 0419  WBC 4.9  HGB 12.2  HCT 37.5  PLT 221   BMET  Recent Labs  12/15/12 0520 12/16/12 0523  NA 138 141  K 4.2 3.8  CL 104 108  CO2 28 27  GLUCOSE 114* 116*  BUN 6 5*  CREATININE 0.76 0.78  CALCIUM 8.6 9.1   LFT  Recent Labs  12/16/12 0523  PROT 6.0  ALBUMIN 3.0*  AST 99*  ALT 245*  ALKPHOS 246*  BILITOT 5.5*   Dg Cholangiogram Operative  12/14/2012   *RADIOLOGY REPORT*  Clinical Data: Choledocholithiasis  INTRAOPERATIVE CHOLANGIOGRAM  Technique:  Multiple fluoroscopic spot radiographs were obtained during intraoperative cholangiogram and are submitted for interpretation post-operatively.  Comparison: ERCP from the previous day  Findings: There is a meniscus in the distal common duct with no passage of contrast distally into the duodenum.  The intrahepatic ducts are incompletely opacified, appearing decompressed centrally, somewhat irregular in contour.  IMPRESSION  1.   Obstructing process in the distal common duct, probably retained obstructing calculi given the more normal appearance just yesterday.   Original Report Authenticated By: D. Andria Rhein, MD    Assessment / Plan: 1) Choledocholithiasis - IOC shows this - looks like she had stones drop into CBD between ERCP and lap chole. Bili is up more today.  ERCP with general anesthesia this afternoon.   LOS: 4 days   ZEHR, JESSICA D.  12/16/2012, 8:28 AM  Pager number [phone]  [facility] Attending  I have also seen and assessed the patient and agree with the above note. The risks and benefits as well as alternatives of endoscopic procedure(s) have been discussed and reviewed. All questions answered. The patient agrees to proceed.

## 2012-12-16 NOTE — Op Note (Signed)
[facility] [address] [place], [address]   ERCP PROCEDURE REPORT  PATIENT: Lori Webb, Lori Webb.  MR# :[id number] BIRTHDATE: [birth year]  GENDER: Female ENDOSCOPIST: Iva Boop, MD, [facility] PROCEDURE DATE:  12/16/2012 PROCEDURE:   ERCP with removal of calculus/calculi ASA CLASS:   Class III INDICATIONS:established bile duct stone(s). Obstruction of distal CBD on IOC after negative ERCP and sphincterotomy pre-op MEDICATIONS: General endotracheal anesthesia (GETA), See Anesthesia Report, and Cipro 400 mg IV TOPICAL ANESTHETIC: none  DESCRIPTION OF PROCEDURE:   After the risks benefits and alternatives of the procedure were thoroughly explained, informed consent was obtained.  The Pentax ERCP [id number]  endoscope was introduced through the mouth  and advanced to the second portion of the duodenum . The esophagus was not seen well. The stomach was normal. There was a prior sphincterotomy of major papilla. The balloon catheter was introduced and a cholangiogram obtained, showing:  1.  There was dilation of the common bile and hepatic ducts measuring 10mm. Low insertion cystic duct (stump). Mildly dilated intrahepatics. 2.  Multiple stones were seen in the distal common bile duct. 3.  Multiple mixed pigment appearing stones were recovered with balloon sweeps and irrigation w/ sterile saline. There was a thick mucous present also. The scope was then completely withdrawn from the patient and the procedure terminated.     COMPLICATIONS: .  There were no complications.  ENDOSCOPIC IMPRESSION: 1.   There was dilation of the common bile and hepatic ducts measuring 10 mm 2.   Multiple stones in the distal common bile duct were removed with balloon sweeps and irrigation 3.   No pancreatogram by intent  RECOMMENDATIONS: Observe - stop cipro tomorrow if ok w/ GSU clears today - low fat tomorrow if ok      eSigned:  Iva Boop, MD, [facility] 12/16/2012 3:14  PM   CC: Mila Palmer, MD

## 2012-12-16 NOTE — Progress Notes (Signed)
General Surgery Note  LOS: 4 days  POD -  2 Days Post-Op  Assessment/Plan: 1.  LAPAROSCOPIC CHOLECYSTECTOMY WITH INTRAOPERATIVE CHOLANGIOGRAM - 12/14/2012 - [facility]  On cipro.  Doing okay from surgery.  Will leave drain until after ERCP completed and bili coming down.  Patient request something for nerves.  I'll give some IV Ativan.  2.  Probable retained common bile duct stones   ERCP/sphincterotomy by Dr. Leone Payor - 12/13/2012 - saw no stones.  T. Bili - 5.5 - 12/16/2012  Increasing bili means she probably needs another ERCP   3.  DVT prophylaxis - SQ Heparin 4.  History of diverticulosis 5.  History of anxiety  Subjective:  Unhappy patient.  Says that if ERCP done, would want general anesthesia.  She will discuss this with Dr. Leone Payor.    Objective:   Filed Vitals:   12/16/12 0500  BP: 143/60  Pulse: 63  Temp: 98.2 F (36.8 C)  Resp: 18     Intake/Output from previous day:  09/06 0701 - 09/07 0700 In: 3600 [I.V.:3600] Out: 4105 [Urine:4000; Drains:105]  Intake/Output this shift:      Physical Exam:   General: Jaundiced WF who is alert and oriented. Tearful.   HEENT: Normal. Pupils equal. .   Lungs: Clear   Abdomen: Soft.  Has BS   Wound: Okay.  Has RUQ drain - 105 cc yesterday.   Lab Results:     Recent Labs  12/14/12 0419  WBC 4.9  HGB 12.2  HCT 37.5  PLT 221    BMET    Recent Labs  12/15/12 0520 12/16/12 0523  NA 138 141  K 4.2 3.8  CL 104 108  CO2 28 27  GLUCOSE 114* 116*  BUN 6 5*  CREATININE 0.76 0.78  CALCIUM 8.6 9.1    PT/INR  No results found for this basename: LABPROT, INR,  in the last 72 hours  ABG  No results found for this basename: PHART, PCO2, PO2, HCO3,  in the last 72 hours   Studies/Results:  Dg Cholangiogram Operative  12/14/2012   *RADIOLOGY REPORT*  Clinical Data: Choledocholithiasis  INTRAOPERATIVE CHOLANGIOGRAM  Technique:  Multiple fluoroscopic spot radiographs were obtained during intraoperative cholangiogram and  are submitted for interpretation post-operatively.  Comparison: ERCP from the previous day  Findings: There is a meniscus in the distal common duct with no passage of contrast distally into the duodenum.  The intrahepatic ducts are incompletely opacified, appearing decompressed centrally, somewhat irregular in contour.  IMPRESSION  1.  Obstructing process in the distal common duct, probably retained obstructing calculi given the more normal appearance just yesterday.   Original Report Authenticated By: D. Andria Rhein, MD     Anti-infectives:   Anti-infectives   Start     Dose/Rate Route Frequency Ordered Stop   12/13/12 0600  ceFAZolin (ANCEF) IVPB 2 g/50 mL premix  Status:  Discontinued     2 g 100 mL/hr over 30 Minutes Intravenous On call to O.R. 12/12/12 1618 12/13/12 1319   12/12/12 1600  ciprofloxacin (CIPRO) IVPB 400 mg     400 mg 200 mL/hr over 60 Minutes Intravenous 2 times daily 12/12/12 1422        Ovidio Kin, MD, FACS Pager: [phone],   [facility]: [phone] 12/16/2012

## 2012-12-16 NOTE — Preoperative (Signed)
Beta Blockers   Reason not to administer Beta Blockers:Not Applicable 

## 2012-12-16 NOTE — Transfer of Care (Signed)
Immediate Anesthesia Transfer of Care Note  Patient: Lori Webb  Procedure(s) Performed: Procedure(s): ENDOSCOPIC RETROGRADE CHOLANGIOPANCREATOGRAPHY (ERCP) (N/A)  Patient Location: PACU  Anesthesia Type:General  Level of Consciousness: awake, alert  and oriented  Airway & Oxygen Therapy: Patient Spontanous Breathing and Patient connected to face mask oxygen  Post-op Assessment: Report given to PACU RN and Post -op Vital signs reviewed and stable  Post vital signs: Reviewed and stable  Complications: No apparent anesthesia complications

## 2012-12-16 NOTE — Anesthesia Postprocedure Evaluation (Signed)
  Anesthesia Post-op Note  Patient: Lori Webb  Procedure(s) Performed: Procedure(s) (LRB): ENDOSCOPIC RETROGRADE CHOLANGIOPANCREATOGRAPHY (ERCP) (N/A)  Patient Location: PACU  Anesthesia Type: General  Level of Consciousness: awake and alert   Airway and Oxygen Therapy: Patient Spontanous Breathing  Post-op Pain: mild  Post-op Assessment: Post-op Vital signs reviewed, Patient's Cardiovascular Status Stable, Respiratory Function Stable, Patent Airway and No signs of Nausea or vomiting  Last Vitals:  Filed Vitals:   12/16/12 1545  BP: 140/62  Pulse: 81  Temp:   Resp: 17    Post-op Vital Signs: stable   Complications: No apparent anesthesia complications

## 2012-12-16 NOTE — Anesthesia Preprocedure Evaluation (Addendum)
Anesthesia Evaluation  Patient identified by MRN, date of birth, ID band Patient awake    Reviewed: Allergy & Precautions, H&P , NPO status , Patient's Chart, lab work & pertinent test results  Airway Mallampati: II TM Distance: >3 FB Neck ROM: Full    Dental no notable dental hx.    Pulmonary neg pulmonary ROS,  breath sounds clear to auscultation  Pulmonary exam normal       Cardiovascular Exercise Tolerance: Good negative cardio ROS  Rhythm:Regular Rate:Normal     Neuro/Psych PSYCHIATRIC DISORDERS Anxiety negative neurological ROS     GI/Hepatic negative GI ROS, Neg liver ROS,   Endo/Other  Hypothyroidism   Renal/GU negative Renal ROS  negative genitourinary   Musculoskeletal negative musculoskeletal ROS (+)   Abdominal   Peds negative pediatric ROS (+)  Hematology negative hematology ROS (+)   Anesthesia Other Findings   Reproductive/Obstetrics negative OB ROS                           Anesthesia Physical Anesthesia Plan  ASA: II  Anesthesia Plan: General   Post-op Pain Management:    Induction: Intravenous  Airway Management Planned: Oral ETT  Additional Equipment:   Intra-op Plan:   Post-operative Plan: Extubation in OR  Informed Consent: I have reviewed the patients History and Physical, chart, labs and discussed the procedure including the risks, benefits and alternatives for the proposed anesthesia with the patient or authorized representative who has indicated his/her understanding and acceptance.   Dental advisory given  Plan Discussed with: CRNA  Anesthesia Plan Comments:         Anesthesia Quick Evaluation

## 2012-12-17 ENCOUNTER — Encounter (HOSPITAL_COMMUNITY): Payer: Self-pay | Admitting: Internal Medicine

## 2012-12-17 DIAGNOSIS — K838 Other specified diseases of biliary tract: Secondary | ICD-10-CM | POA: Diagnosis not present

## 2012-12-17 DIAGNOSIS — K8050 Calculus of bile duct without cholangitis or cholecystitis without obstruction: Secondary | ICD-10-CM | POA: Diagnosis not present

## 2012-12-17 LAB — COMPREHENSIVE METABOLIC PANEL
ALT: 151 U/L — ABNORMAL HIGH (ref 0–35)
Albumin: 2.6 g/dL — ABNORMAL LOW (ref 3.5–5.2)
Alkaline Phosphatase: 226 U/L — ABNORMAL HIGH (ref 39–117)
BUN: 5 mg/dL — ABNORMAL LOW (ref 6–23)
Chloride: 105 mEq/L (ref 96–112)
Glucose, Bld: 117 mg/dL — ABNORMAL HIGH (ref 70–99)
Potassium: 3.6 mEq/L (ref 3.5–5.1)
Total Bilirubin: 3.2 mg/dL — ABNORMAL HIGH (ref 0.3–1.2)

## 2012-12-17 LAB — AMYLASE: Amylase: 21 U/L (ref 0–105)

## 2012-12-17 MED ORDER — BISACODYL 10 MG RE SUPP
10.0000 mg | Freq: Once | RECTAL | Status: AC
  Administered 2012-12-17: 10 mg via RECTAL
  Filled 2012-12-17: qty 1

## 2012-12-17 NOTE — ED Provider Notes (Signed)
CSN: [id number]     Arrival date & time 12/12/12  1201 History   First MD Initiated Contact with Patient 12/12/12 1242     Chief Complaint  Patient presents with  . Abdominal Pain   (Consider location/radiation/quality/duration/timing/severity/associated sxs/prior Treatment) HPI Comments: 72 yo female with known cholelithiasis and fup with surgery presents with worsening and recurrent RUQ pain since earlier today.  Similar to previous.  Pt ate early this am. No fevers.  Vomiting and nausea.  Unable to tolerate food.  No bleeding. Nothing improves.  Worse with food.  Dr Rayburn Ma surgery.Diverticulitis hx.   Patient is a 72 y.o. female presenting with abdominal pain. The history is provided by the patient.  Abdominal Pain Associated symptoms: nausea and vomiting   Associated symptoms: no chest pain, no chills, no dysuria, no fever and no shortness of breath     Past Medical History  Diagnosis Date  . Diverticulosis   . Anxiety   . Cholelithiasis with obstruction 12/12/2012  . Hypothyroid 12/12/2012    S/p thyroidectomy for Goiter.  Marland Kitchen Hx of diverticulitis of colon 12/12/2012    11/21/12 first diagnosis of this.   Past Surgical History  Procedure Laterality Date  . Cesarean section      x2  . Thyroidectomy    . Tonsillectomy    . Appendectomy    . Abdominal hysterectomy    . Dilation and curettage of uterus    . Plantar fascia release  2009    left heel  . Colonoscopy    . Mass excision  05/02/2012    Procedure: EXCISION MASS;  Surgeon: Shelly Rubenstein, MD;  Location: [facility];  Service: General;  Laterality: Right;  excision cyst right upper back  . Ercp N/A 12/13/2012    Procedure: ENDOSCOPIC RETROGRADE CHOLANGIOPANCREATOGRAPHY (ERCP);  Surgeon: Iva Boop, MD;  Location: [facility];  Service: Endoscopy;  Laterality: N/A;  . Ercp N/A 12/16/2012    Procedure: ENDOSCOPIC RETROGRADE CHOLANGIOPANCREATOGRAPHY (ERCP);  Surgeon: Iva Boop, MD;  Location: [facility];   Service: Gastroenterology;  Laterality: N/A;   Family History  Problem Relation Age of Onset  . Cancer Mother     breast  . Cancer Maternal Grandmother     breast   History  Substance Use Topics  . Smoking status: Never Smoker   . Smokeless tobacco: Never Used  . Alcohol Use: 0.6 oz/week    1 Glasses of wine per week     Comment: 1 glass of wine per night   OB History   Grav Para Term Preterm Abortions TAB SAB Ect Mult Living                 Review of Systems  Constitutional: Positive for appetite change. Negative for fever and chills.  HENT: Negative for neck pain and neck stiffness.   Eyes: Negative for visual disturbance.  Respiratory: Negative for shortness of breath.   Cardiovascular: Negative for chest pain.  Gastrointestinal: Positive for nausea, vomiting and abdominal pain.  Genitourinary: Negative for dysuria and flank pain.  Musculoskeletal: Positive for back pain.  Skin: Negative for rash.  Neurological: Positive for light-headedness. Negative for headaches.    Allergies  Review of patient's allergies indicates no known allergies.  Home Medications  No current outpatient prescriptions on file. BP 117/71  Pulse 66  Temp(Src) 98.7 F (37.1 C) (Oral)  Resp 18  Ht 5\' 3"  (1.6 m)  Wt 145 lb 2.2 oz (65.834 kg)  BMI 25.72  kg/m2  SpO2 97% Physical Exam  Nursing note and vitals reviewed. Constitutional: She is oriented to person, place, and time. She appears well-developed and well-nourished.  HENT:  Head: Normocephalic and atraumatic.  Dry mm  Eyes: Conjunctivae are normal. Right eye exhibits no discharge. Left eye exhibits no discharge.  Neck: Normal range of motion. Neck supple. No tracheal deviation present.  Cardiovascular: Normal rate and regular rhythm.   Pulmonary/Chest: Effort normal and breath sounds normal.  Abdominal: Soft. She exhibits no distension. There is tenderness (epig and RUQ). There is no guarding.  Musculoskeletal: She exhibits no  edema.  Neurological: She is alert and oriented to person, place, and time.  Skin: Skin is warm. No rash noted.  Psychiatric: She has a normal mood and affect.    ED Course  Procedures (including critical care time) Labs Review Labs Reviewed  COMPREHENSIVE METABOLIC PANEL - Abnormal; Notable for the following:    Glucose, Bld 103 (*)    AST 367 (*)    ALT 498 (*)    Alkaline Phosphatase 199 (*)    Total Bilirubin 6.1 (*)    GFR calc non Af Amer 84 (*)    All other components within normal limits  URINALYSIS, ROUTINE W REFLEX MICROSCOPIC - Abnormal; Notable for the following:    Color, Urine ORANGE (*)    Bilirubin Urine LARGE (*)    Nitrite POSITIVE (*)    Leukocytes, UA SMALL (*)    All other components within normal limits  COMPREHENSIVE METABOLIC PANEL - Abnormal; Notable for the following:    Glucose, Bld 104 (*)    Total Protein 5.7 (*)    Albumin 2.9 (*)    AST 176 (*)    ALT 326 (*)    Alkaline Phosphatase 164 (*)    Total Bilirubin 2.3 (*)    GFR calc non Af Amer 81 (*)    All other components within normal limits  COMPREHENSIVE METABOLIC PANEL - Abnormal; Notable for the following:    Glucose, Bld 114 (*)    Total Protein 5.6 (*)    Albumin 2.8 (*)    AST 165 (*)    ALT 285 (*)    Alkaline Phosphatase 198 (*)    Total Bilirubin 3.7 (*)    GFR calc non Af Amer 82 (*)    All other components within normal limits  COMPREHENSIVE METABOLIC PANEL - Abnormal; Notable for the following:    Glucose, Bld 116 (*)    BUN 5 (*)    Albumin 3.0 (*)    AST 99 (*)    ALT 245 (*)    Alkaline Phosphatase 246 (*)    Total Bilirubin 5.5 (*)    GFR calc non Af Amer 82 (*)    All other components within normal limits  COMPREHENSIVE METABOLIC PANEL - Abnormal; Notable for the following:    Glucose, Bld 117 (*)    BUN 5 (*)    Total Protein 5.4 (*)    Albumin 2.6 (*)    AST 46 (*)    ALT 151 (*)    Alkaline Phosphatase 226 (*)    Total Bilirubin 3.2 (*)    GFR calc non  Af Amer 83 (*)    All other components within normal limits  URINE CULTURE  SURGICAL PCR SCREEN  CBC WITH DIFFERENTIAL  LIPASE, BLOOD  URINE MICROSCOPIC-ADD ON  CBC  AMYLASE  LIPASE, BLOOD  CBC  AMYLASE  LIPASE, BLOOD   Imaging Review Dg  Ercp  12/16/2012   *RADIOLOGY REPORT*  Clinical Data: Possible common bile duct stones.  ERCP  Comparison:  Intraoperative cholangiogram of 12/14/2012.  CT 11/21/2012.  Technique:  Multiple spot images obtained with the fluoroscopic device and submitted for interpretation post-procedure.  ERCP was performed by Dr. Leone Payor.  Findings: Injection in the common duct demonstrates borderline dilatation.  There is a catheter which likely within the cystic duct remnant.  Balloon retrieval through the common duct, without persistent filling defect to suggest stone.  IMPRESSION: Balloon pull-through the common duct.  No convincing evidence of choledocholithiasis.  These images were submitted for radiologic interpretation only. Please see the procedural report for the amount of contrast and the fluoroscopy time utilized.   Original Report Authenticated By: Jeronimo Greaves, M.D.    MDM   1. Dilated bile duct   2. Cholelithiasis with obstruction   3. Choledocholithiasis    Known gallstones with new elevations in bilirubin and LFTs.  Concern for choledocholithiasis.  Consulted surgery for admission. Pain and nausea meds in ED, pt improved. Surgery admitted.   Enid Skeens, MD 12/17/12 1311

## 2012-12-17 NOTE — Care Management Note (Signed)
    Page 1 of 1   12/17/2012     11:53:44 AM   CARE MANAGEMENT NOTE 12/17/2012  Patient:  Lori Webb, Lori Webb   Account Number:  [id number]  Date Initiated:  12/17/2012  Documentation initiated by:  Lorenda Ishihara  Subjective/Objective Assessment:   72 yo female admitted s/p lap chole, ERCP both pre and post op. PTA lived at home with spouse.     Action/Plan:   Home when stable   Anticipated DC Date:  12/18/2012   Anticipated DC Plan:  HOME/SELF CARE      DC Planning Services  CM consult      Choice offered to / List presented to:             Status of service:  Completed, signed off Medicare Important Message given?   (If response is "NO", the following Medicare IM given date fields will be blank) Date Medicare IM given:   Date Additional Medicare IM given:    Discharge Disposition:  HOME/SELF CARE  Per UR Regulation:  Reviewed for med. necessity/level of care/duration of stay  If discussed at Long Length of Stay Meetings, dates discussed:    Comments:

## 2012-12-17 NOTE — Progress Notes (Signed)
Patient ID: Lori Webb, female   DOB: [birth year], 72 y.o.   MRN: [id number] General Surgery Note  LOS: 5 days   Assessment/Plan: 1.  LAPAROSCOPIC CHOLECYSTECTOMY WITH INTRAOPERATIVE CHOLANGIOGRAM - 12/14/2012 - [facility]  On cipro.  Doing okay from surgery.    Advance diet to soft, then regular today  Will give supp today  Likely home tomorrow  Recheck CMP in AM  Will leave drain until day of discharge    2.  Probable retained common bile duct stones   ERCP/sphincterotomy by Dr. Leone Payor - 12/13/2012  Repeat ERCP with balloon sweep 12/16/12 - cleared.     3.  DVT prophylaxis - SQ Heparin 4.  History of diverticulosis 5.  History of anxiety  Subjective:  Doing ok today, mild nausea, expected pain at surgical sites, one episode of emesis, c/o constipation, voiding a lot  Objective:   Filed Vitals:   12/17/12 0458  BP: 119/65  Pulse: 56  Temp: 97.3 F (36.3 C)  Resp: 18     Intake/Output from previous day:  09/07 0701 - 09/08 0700 In: 2675 [I.V.:2675] Out: 3635 [Urine:3600; Drains:35]  Intake/Output this shift:      Physical Exam:   General: NAD   Abdomen: soft, mildly tender over incisions, drain with mostly serous looking output, incisions c/d/i with steri-strips, some leaking around drain   Drain 35cc/24hr   Lab Results:    No results found for this basename: WBC, HGB, HCT, PLT,  in the last 72 hours  BMET    Recent Labs  12/16/12 0523 12/17/12 0411  NA 141 138  K 3.8 3.6  CL 108 105  CO2 27 28  GLUCOSE 116* 117*  BUN 5* 5*  CREATININE 0.78 0.74  CALCIUM 9.1 8.7    PT/INR  No results found for this basename: LABPROT, INR,  in the last 72 hours  ABG  No results found for this basename: PHART, PCO2, PO2, HCO3,  in the last 72 hours   Studies/Results:  Dg Ercp  12/16/2012   *RADIOLOGY REPORT*  Clinical Data: Possible common bile duct stones.  ERCP  Comparison:  Intraoperative cholangiogram of 12/14/2012.  CT 11/21/2012.  Technique:  Multiple spot  images obtained with the fluoroscopic device and submitted for interpretation post-procedure.  ERCP was performed by Dr. Leone Payor.  Findings: Injection in the common duct demonstrates borderline dilatation.  There is a catheter which likely within the cystic duct remnant.  Balloon retrieval through the common duct, without persistent filling defect to suggest stone.  IMPRESSION: Balloon pull-through the common duct.  No convincing evidence of choledocholithiasis.  These images were submitted for radiologic interpretation only. Please see the procedural report for the amount of contrast and the fluoroscopy time utilized.   Original Report Authenticated By: Jeronimo Greaves, M.D.     Anti-infectives:   Anti-infectives   Start     Dose/Rate Route Frequency Ordered Stop   12/13/12 0600  ceFAZolin (ANCEF) IVPB 2 g/50 mL premix  Status:  Discontinued     2 g 100 mL/hr over 30 Minutes Intravenous On call to O.R. 12/12/12 1618 12/13/12 1319   12/12/12 1600  ciprofloxacin (CIPRO) IVPB 400 mg     400 mg 200 mL/hr over 60 Minutes Intravenous 2 times daily 12/12/12 1422        WH[facility], [facility] Office: [phone] 12/17/2012 7:49 AM

## 2012-12-17 NOTE — Progress Notes (Signed)
Agree with A&P of EW,PA. Suppository should help. Likely go home tomorrow

## 2012-12-17 NOTE — Progress Notes (Signed)
[facility] Progress Note    Since last GI note: ERCP yesterday with Dr. Leone Payor, several bile duct stones removed (full report in chart).  Wants to eat.  No signficant pains this AM. Walking in room, rearranging furniture.   Objective: Vital signs in last 24 hours: Temp:  [97.3 F (36.3 C)-100 F (37.8 C)] 97.3 F (36.3 C) (09/08 0458) Pulse Rate:  [56-81] 56 (09/08 0458) Resp:  [11-22] 18 (09/08 0458) BP: (115-146)/(52-77) 119/65 mmHg (09/08 0458) SpO2:  [94 %-100 %] 97 % (09/08 0458)   General: alert and oriented times 3 Heart: regular rate and rythm Abdomen: soft, mildly, appropriately tender, non-distended, normal bowel sounds   Recent Labs  12/15/12 0520 12/16/12 0523 12/17/12 0411  NA 138 141 138  K 4.2 3.8 3.6  CL 104 108 105  CO2 28 27 28   GLUCOSE 114* 116* 117*  BUN 6 5* 5*  CREATININE 0.76 0.78 0.74  CALCIUM 8.6 9.1 8.7    Recent Labs  12/15/12 0520 12/16/12 0523 12/17/12 0411  PROT 5.6* 6.0 5.4*  ALBUMIN 2.8* 3.0* 2.6*  AST 165* 99* 46*  ALT 285* 245* 151*  ALKPHOS 198* 246* 226*  BILITOT 3.7* 5.5* 3.2*    Medications: Scheduled Meds: . bisacodyl  10 mg Rectal Once  . ciprofloxacin  400 mg Intravenous BID  . famotidine (PEPCID) IV  20 mg Intravenous Q12H  . heparin  5,000 Units Subcutaneous Q8H  . levothyroxine  100 mcg Oral QAC breakfast  . loratadine  10 mg Oral Daily   Continuous Infusions: . dextrose 5 % and 0.45 % NaCl with KCl 20 mEq/L 100 mL/hr at 12/17/12 0248   PRN Meds:.acetaminophen, acetaminophen, diphenhydrAMINE, diphenhydrAMINE, HYDROmorphone (DILAUDID) injection, LORazepam, ondansetron, oxyCODONE, promethazine    Assessment/Plan: 72 y.o. female with gallstone disease  CBD cleared yesterday with ERCP, sphincterotomy.  Bilirubin a bit lower today and should continue to improve.  We will set up repeat LFTs in 7-10 days to make sure they normalize.  I agree with advancing diet as tolerated with idea that she may  be safe for d/c tomorrow.    Rob Bunting, MD  12/17/2012, 8:06 AM [facility] Pager [phone]

## 2012-12-18 LAB — COMPREHENSIVE METABOLIC PANEL
ALT: 100 U/L — ABNORMAL HIGH (ref 0–35)
AST: 25 U/L (ref 0–37)
Albumin: 2.7 g/dL — ABNORMAL LOW (ref 3.5–5.2)
Alkaline Phosphatase: 210 U/L — ABNORMAL HIGH (ref 39–117)
BUN: 8 mg/dL (ref 6–23)
CO2: 29 mEq/L (ref 19–32)
Calcium: 9 mg/dL (ref 8.4–10.5)
Chloride: 104 mEq/L (ref 96–112)
Creatinine, Ser: 0.74 mg/dL (ref 0.50–1.10)
GFR calc Af Amer: >90 mL/min (ref >90)
GFR calc non Af Amer: 83 mL/min — ABNORMAL LOW (ref >90)
Glucose, Bld: 115 mg/dL — ABNORMAL HIGH (ref 70–99)
Potassium: 3.4 mEq/L — ABNORMAL LOW (ref 3.5–5.1)
Sodium: 139 mEq/L (ref 135–145)
Total Bilirubin: 1.6 mg/dL — ABNORMAL HIGH (ref 0.3–1.2)
Total Protein: 5.5 g/dL — ABNORMAL LOW (ref 6.0–8.3)

## 2012-12-18 MED ORDER — OXYCODONE HCL 5 MG PO TABS: 5.0000 mg | ORAL_TABLET | Freq: Four times a day (QID) | ORAL | Status: DC | PRN

## 2012-12-18 NOTE — Progress Notes (Signed)
Verbal order from Pa White to discontinue JP drain, order entered Stanford Breed RN 12-18-2012 Stanford Breed RN 8:53am

## 2012-12-18 NOTE — Discharge Summary (Signed)
  Physician Discharge Summary  Patient ID: BENELLI WINTHER MRN: [id number] DOB/AGE: 72/26/1942 72 y.o.  Admit date: 12/12/2012 Discharge date: 12/18/2012  Admitting Diagnosis: Abdominal pain Choledocholithiasis Elevated LFTs  Discharge Diagnosis Patient Active Problem List   Diagnosis Date Noted  . Choledocholithiasis 12/15/2012  . Dilated bile duct 12/13/2012  . Hx of gastroesophageal reflux (GERD) 12/12/2012  . Cholelithiasis with obstruction 12/12/2012  . Hypothyroid 12/12/2012  . Hx of diverticulitis of colon 12/12/2012  . Symptomatic cholelithiasis 12/11/2012  . Infected sebaceous cyst of skin 04/17/2012    Consultants GI  Procedures #1: 12/13/12 ERCP w/sphincterotomy and balloon sweeps #2: 12/14/12 Lap Chole #3: 12/16/12 ERCP w/stone removal with balloon sweeps   Hospital Course:  72 yr old female who [facility] to [facility] with abdominal pain, nausea and vomiting.  She had known gallstone and was scheduled to have lap chol with Dr. Magnus Ivan.  Unfortunately her symptoms worsened.  Evaluation showed dilated CBD and elevated LFTs.  GI was consulted and the patient underwent a ERCP with sphinterotomy and balloon sweeps.  After this her labs began trending down and she underwent Lap chole.  Post-op her LFTs again went up and therefore GI did a second ERCP with stone removal via balloon sweeps.  This showed clearing of the stones.  Her LFTs began trending down and her diet was advanced.  On 9/9, tolerating diet well, walking well, pain well controlled and she was felt stable for discharge home.  Her JP drain will be removed prior to discharge.  She will follow up with Dr. Abbey Chatters in 2 weeks for a recheck.  Physical Exam: VSS afebrile Heart: RRR Lungs; CTA bil General: NAD Abd: soft, mildly tender, incisions with steri-strips, c/d/i, drain with serous drainage, +BS     Medication List    STOP taking these medications       HYDROcodone-acetaminophen 5-325 MG per tablet   Commonly known as:  NORCO/VICODIN      TAKE these medications       amoxicillin-clavulanate 875-125 MG per tablet  Commonly known as:  AUGMENTIN  Take 1 tablet by mouth 2 (two) times daily. For 14 days; Start date 12/11/12     cetirizine 10 MG tablet  Commonly known as:  ZYRTEC  Take 10 mg by mouth daily as needed for allergies.     levothyroxine 100 MCG tablet  Commonly known as:  SYNTHROID, LEVOTHROID  Take 100 mcg by mouth daily.     oxyCODONE 5 MG immediate release tablet  Commonly known as:  Oxy IR/ROXICODONE  Take 1-3 tablets (5-15 mg total) by mouth every 6 (six) hours as needed.             Follow-up Information   Follow up with ROSENBOWER,TODD J, MD. Schedule an appointment as soon as possible for a visit in 2 weeks. (Our office will contact you with your follow up appt.)    Specialty:  General Surgery   Contact information:   [address] [place] [address] [phone]       Signed: Denny Levy [facility] [phone]  12/18/2012, 7:15 AM

## 2012-12-18 NOTE — Discharge Summary (Signed)
Patient seen before discharge and doing OK and ready to go

## 2012-12-18 NOTE — Discharge Instructions (Signed)
Change dressing over drain site twice a day with neopsorin or antibacterial cream and cover with a dry dressing Ok to shower with dressing off.    [facility] ______[facility] LAPAROSCOPIC SURGERY: POST OP INSTRUCTIONS Always review your discharge instruction sheet given to you by the facility where your surgery was performed. IF YOU HAVE DISABILITY OR FAMILY LEAVE FORMS, YOU MUST BRING THEM TO THE OFFICE FOR PROCESSING.   DO NOT GIVE THEM TO YOUR DOCTOR.  1. A prescription for pain medication may be given to you upon discharge.  Take your pain medication as prescribed, if needed.  If narcotic pain medicine is not needed, then you may take acetaminophen (Tylenol) or ibuprofen (Advil) as needed. 2. Take your usually prescribed medications unless otherwise directed. 3. If you need a refill on your pain medication, please contact your pharmacy.  They will contact our office to request authorization. Prescriptions will not be filled after 5pm or on week-ends. 4. You should follow a light diet the first few days after arrival home, such as soup and crackers, etc.  Be sure to include lots of fluids daily. 5. Most patients will experience some swelling and bruising in the area of the incisions.  Ice packs will help.  Swelling and bruising can take several days to resolve.  6. It is common to experience some constipation if taking pain medication after surgery.  Increasing fluid intake and taking a stool softener (such as Colace) will usually help or prevent this problem from occurring.  A mild laxative (Milk of Magnesia or Miralax) should be taken according to package instructions if there are no bowel movements after 48 hours. 7. Unless discharge instructions indicate otherwise, you may remove your bandages 24-48 hours after surgery, and you may shower at that time.  You may have steri-strips (small skin tapes) in place directly over the incision.  These strips should be left on the skin for  7-10 days.  If your surgeon used skin glue on the incision, you may shower in 24 hours.  The glue will flake off over the next 2-3 weeks.  Any sutures or staples will be removed at the office during your follow-up visit. 8. ACTIVITIES:  You may resume regular (light) daily activities beginning the next day--such as daily self-care, walking, climbing stairs--gradually increasing activities as tolerated.  You may have sexual intercourse when it is comfortable.  Refrain from any heavy lifting or straining until approved by your doctor. a. You may drive when you are no longer taking prescription pain medication, you can comfortably wear a seatbelt, and you can safely maneuver your car and apply brakes. b. RETURN TO WORK:  __________________________________________________________ 9. You should see your doctor in the office for a follow-up appointment approximately 2-3 weeks after your surgery.  Make sure that you call for this appointment within a day or two after you arrive home to insure a convenient appointment time. 10. OTHER INSTRUCTIONS: __________________________________________________________________________________________________________________________ __________________________________________________________________________________________________________________________ WHEN TO CALL YOUR DOCTOR: 1. Fever over 101.0 2. Inability to urinate 3. Continued bleeding from incision. 4. Increased pain, redness, or drainage from the incision. 5. Increasing abdominal pain  The clinic staff is available to answer your questions during regular business hours.  Please don't hesitate to call and ask to speak to one of the nurses for clinical concerns.  If you have a medical emergency, go to the nearest emergency room or call 911.  A surgeon from [facility] is always on call at the hospital. [address], [place], [place]  [address] ? [address] [address], [place], [place]    [address] [phone] ? [phone] ? FAX [phone] Web site: [email or web address]

## 2012-12-20 ENCOUNTER — Ambulatory Visit: Admit: 2012-12-20 | Payer: Medicare Other | Admitting: Surgery

## 2012-12-20 SURGERY — LAPAROSCOPIC CHOLECYSTECTOMY
Anesthesia: General

## 2012-12-21 ENCOUNTER — Telehealth (INDEPENDENT_AMBULATORY_CARE_PROVIDER_SITE_OTHER): Payer: Self-pay

## 2012-12-21 NOTE — Telephone Encounter (Signed)
Give Zofran and if that is not working then give Phenergan.

## 2012-12-21 NOTE — Telephone Encounter (Signed)
Pt calling b/c she has been nauseated for one week since home from her gallbladder surgery. The pt did have something for nausea at home which she took but really has not resolved the nausea issue. The pt wants to know if this is normal after her surgery b/c she said she has not had a straight forward surgery. The pt was in the hospital for a while and had to be put to sleep three days in a row. Please advise.

## 2012-12-29 DIAGNOSIS — Z23 Encounter for immunization: Secondary | ICD-10-CM | POA: Diagnosis not present

## 2013-01-08 ENCOUNTER — Encounter (INDEPENDENT_AMBULATORY_CARE_PROVIDER_SITE_OTHER): Payer: Medicare Other | Admitting: Surgery

## 2013-01-09 ENCOUNTER — Ambulatory Visit (INDEPENDENT_AMBULATORY_CARE_PROVIDER_SITE_OTHER): Payer: Medicare Other | Admitting: General Surgery

## 2013-01-09 ENCOUNTER — Encounter (INDEPENDENT_AMBULATORY_CARE_PROVIDER_SITE_OTHER): Payer: Self-pay | Admitting: General Surgery

## 2013-01-09 VITALS — BP 110/68 | HR 72 | Temp 97.9°F | Resp 16 | Ht 63.0 in | Wt 145.2 lb

## 2013-01-09 DIAGNOSIS — Z9889 Other specified postprocedural states: Secondary | ICD-10-CM

## 2013-01-09 NOTE — Patient Instructions (Signed)
Activities as tolerated. Stay on a low-fat diet. Call us if the diarrhea gets worse.

## 2013-01-09 NOTE — Progress Notes (Signed)
Procedure:  Laparoscopic cholecystectomy with cholangiogram for cholelithiasis and choledocholithiasis  Date:  12/14/2012  Pathology:  Chronic cholecystitis and cholelithiasis  History:  She is here for her first postoperative visit. She also had a preoperative ERCP and a postoperative ERCP to remove common bile duct stones. She's having some soreness in the right upper quadrant area. She has postprandial diarrhea after breakfast but is okay for the rest of the day.  Exam: General- Is in NAD. Abdomen-soft, incisions are clean and intact.  Assessment:  Some postprandial diarrhea post ERCPs and laparoscopic cholecystectomy. I suspect this will improve with time.  Plan:  Low fat diet. Activities as tolerated. Return when necessary.

## 2013-02-09 ENCOUNTER — Emergency Department (HOSPITAL_COMMUNITY): Payer: Medicare Other

## 2013-02-09 ENCOUNTER — Inpatient Hospital Stay (HOSPITAL_COMMUNITY)
Admission: EM | Admit: 2013-02-09 | Discharge: 2013-02-11 | DRG: 690 | Disposition: A | Discharge status: Home / Self Care | Payer: Medicare Other | Attending: Internal Medicine | Admitting: Internal Medicine

## 2013-02-09 ENCOUNTER — Encounter (HOSPITAL_COMMUNITY): Payer: Self-pay | Admitting: Emergency Medicine

## 2013-02-09 DIAGNOSIS — R112 Nausea with vomiting, unspecified: Secondary | ICD-10-CM | POA: Diagnosis present

## 2013-02-09 DIAGNOSIS — D72829 Elevated white blood cell count, unspecified: Secondary | ICD-10-CM | POA: Diagnosis present

## 2013-02-09 DIAGNOSIS — N390 Urinary tract infection, site not specified: Secondary | ICD-10-CM | POA: Diagnosis not present

## 2013-02-09 DIAGNOSIS — K5730 Diverticulosis of large intestine without perforation or abscess without bleeding: Secondary | ICD-10-CM | POA: Diagnosis not present

## 2013-02-09 DIAGNOSIS — K2970 Gastritis, unspecified, without bleeding: Secondary | ICD-10-CM | POA: Diagnosis not present

## 2013-02-09 DIAGNOSIS — Z8719 Personal history of other diseases of the digestive system: Secondary | ICD-10-CM | POA: Diagnosis not present

## 2013-02-09 DIAGNOSIS — Z79899 Other long term (current) drug therapy: Secondary | ICD-10-CM | POA: Diagnosis not present

## 2013-02-09 DIAGNOSIS — E039 Hypothyroidism, unspecified: Secondary | ICD-10-CM

## 2013-02-09 DIAGNOSIS — R1110 Vomiting, unspecified: Secondary | ICD-10-CM | POA: Diagnosis not present

## 2013-02-09 DIAGNOSIS — R509 Fever, unspecified: Secondary | ICD-10-CM

## 2013-02-09 DIAGNOSIS — R1011 Right upper quadrant pain: Secondary | ICD-10-CM | POA: Diagnosis not present

## 2013-02-09 DIAGNOSIS — Z9089 Acquired absence of other organs: Secondary | ICD-10-CM | POA: Diagnosis not present

## 2013-02-09 DIAGNOSIS — R109 Unspecified abdominal pain: Secondary | ICD-10-CM

## 2013-02-09 DIAGNOSIS — E890 Postprocedural hypothyroidism: Secondary | ICD-10-CM | POA: Diagnosis present

## 2013-02-09 DIAGNOSIS — R1013 Epigastric pain: Secondary | ICD-10-CM | POA: Diagnosis not present

## 2013-02-09 LAB — COMPREHENSIVE METABOLIC PANEL
Alkaline Phosphatase: 105 U/L (ref 39–117)
BUN: 14 mg/dL (ref 6–23)
Calcium: 9.7 mg/dL (ref 8.4–10.5)
GFR calc Af Amer: >90 mL/min (ref >90)
Glucose, Bld: 130 mg/dL — ABNORMAL HIGH (ref 70–99)
Total Protein: 7 g/dL (ref 6.0–8.3)

## 2013-02-09 LAB — CBC WITH DIFFERENTIAL/PLATELET
Eosinophils Absolute: 0.1 10*3/uL (ref 0.0–0.7)
Eosinophils Relative: 1 % (ref 0–5)
Hemoglobin: 14 g/dL (ref 12.0–15.0)
Lymphs Abs: 1 10*3/uL (ref 0.7–4.0)
MCH: 31.5 pg (ref 26.0–34.0)
MCV: 91.9 fL (ref 78.0–100.0)
Monocytes Absolute: 0.3 10*3/uL (ref 0.1–1.0)
Monocytes Relative: 2 % — ABNORMAL LOW (ref 3–12)
RBC: 4.44 MIL/uL (ref 3.87–5.11)

## 2013-02-09 LAB — URINE MICROSCOPIC-ADD ON

## 2013-02-09 LAB — URINALYSIS, ROUTINE W REFLEX MICROSCOPIC
Ketones, ur: NEGATIVE mg/dL
Nitrite: NEGATIVE
pH: 6.5 (ref 5.0–8.0)

## 2013-02-09 MED ORDER — ACETAMINOPHEN 650 MG RE SUPP
650.0000 mg | Freq: Once | RECTAL | Status: DC
  Filled 2013-02-09: qty 1

## 2013-02-09 MED ORDER — ACETAMINOPHEN 325 MG PO TABS
650.0000 mg | ORAL_TABLET | Freq: Once | ORAL | Status: AC
  Administered 2013-02-09: 650 mg via ORAL
  Filled 2013-02-09: qty 2

## 2013-02-09 MED ORDER — ONDANSETRON HCL 4 MG/2ML IJ SOLN
4.0000 mg | Freq: Once | INTRAMUSCULAR | Status: AC
  Administered 2013-02-09: 4 mg via INTRAVENOUS
  Filled 2013-02-09: qty 2

## 2013-02-09 MED ORDER — IOHEXOL 300 MG/ML  SOLN
50.0000 mL | Freq: Once | INTRAMUSCULAR | Status: AC | PRN
  Administered 2013-02-09: 50 mL via ORAL

## 2013-02-09 NOTE — ED Notes (Signed)
Per EMS pt states she began having "flu like symptoms" such as fever of 100.6, chills, and body aches starting today. Pt denies being in contact with anyone that has been out of the country. Pt has vomited x2 today.

## 2013-02-09 NOTE — ED Notes (Signed)
Bed: WU98 Expected date:  Expected time:  Means of arrival:  Comments: EMS/N/V/D hypertension

## 2013-02-09 NOTE — ED Provider Notes (Signed)
CSN: [id number]     Arrival date & time 02/09/13  2102 History   First MD Initiated Contact with Patient 02/09/13 2139     Chief Complaint  Patient presents with  . Chills  . Generalized Body Aches   (Consider location/radiation/quality/duration/timing/severity/associated sxs/prior Treatment) Patient is a 72 y.o. female presenting with general illness.  Illness Location:  Chills, RUQ and epigastric abdominal pain, nausea and vomiting. Severity:  Moderate Onset quality:  Gradual Duration:  1 day Timing:  Constant Progression:  Worsening Chronicity:  New Context:  Recent complicated cholecystectomy Relieved by:  Nothing Worsened by:  Nothing Associated symptoms: fever, nausea and vomiting (NBNB)   Associated symptoms: no abdominal pain, no chest pain, no congestion, no cough, no diarrhea (normal BM this AM) and no shortness of breath     Past Medical History  Diagnosis Date  . Diverticulosis   . Anxiety   . Cholelithiasis with obstruction 12/12/2012  . Hypothyroid 12/12/2012    S/p thyroidectomy for Goiter.  Marland Kitchen Hx of diverticulitis of colon 12/12/2012    11/21/12 first diagnosis of this.   Past Surgical History  Procedure Laterality Date  . Cesarean section      x2  . Thyroidectomy    . Tonsillectomy    . Appendectomy    . Abdominal hysterectomy    . Dilation and curettage of uterus    . Plantar fascia release  2009    left heel  . Colonoscopy    . Mass excision  05/02/2012    Procedure: EXCISION MASS;  Surgeon: Shelly Rubenstein, MD;  Location: [facility];  Service: General;  Laterality: Right;  excision cyst right upper back  . Ercp N/A 12/13/2012    Procedure: ENDOSCOPIC RETROGRADE CHOLANGIOPANCREATOGRAPHY (ERCP);  Surgeon: Iva Boop, MD;  Location: [facility];  Service: Endoscopy;  Laterality: N/A;  . Ercp N/A 12/16/2012    Procedure: ENDOSCOPIC RETROGRADE CHOLANGIOPANCREATOGRAPHY (ERCP);  Surgeon: Iva Boop, MD;  Location: [facility];  Service:  Gastroenterology;  Laterality: N/A;  . Cholecystectomy N/A 12/14/2012    Procedure: LAPAROSCOPIC CHOLECYSTECTOMY WITH INTRAOPERATIVE CHOLANGIOGRAM;  Surgeon: Adolph Pollack, MD;  Location: [facility];  Service: General;  Laterality: N/A;   Family History  Problem Relation Age of Onset  . Cancer Mother     breast  . Cancer Maternal Grandmother     breast   History  Substance Use Topics  . Smoking status: Never Smoker   . Smokeless tobacco: Never Used  . Alcohol Use: 0.6 oz/week    1 Glasses of wine per week     Comment: 1 glass of wine per night   OB History   Grav Para Term Preterm Abortions TAB SAB Ect Mult Living                 Review of Systems  Constitutional: Positive for fever.  HENT: Negative for congestion.   Respiratory: Negative for cough and shortness of breath.   Cardiovascular: Negative for chest pain.  Gastrointestinal: Positive for nausea and vomiting (NBNB). Negative for abdominal pain and diarrhea (normal BM this AM).  All other systems reviewed and are negative.    Allergies  Review of patient's allergies indicates no known allergies.  Home Medications   Current Outpatient Rx  Name  Route  Sig  Dispense  Refill  . levothyroxine (SYNTHROID, LEVOTHROID) 100 MCG tablet   Oral   Take 100 mcg by mouth daily.  BP 102/81  Pulse 80  Temp(Src) 100.1 F (37.8 C) (Oral)  Resp 22  SpO2 97% Physical Exam  Nursing note and vitals reviewed. Constitutional: She is oriented to person, place, and time. She appears well-developed and well-nourished. No distress.  HENT:  Head: Normocephalic and atraumatic.  Mouth/Throat: Oropharynx is clear and moist.  Eyes: Conjunctivae are normal. Pupils are equal, round, and reactive to light. No scleral icterus.  Neck: Neck supple.  Cardiovascular: Normal rate, regular rhythm, normal heart sounds and intact distal pulses.   No murmur heard. Pulmonary/Chest: Effort normal and breath sounds normal. No stridor. No  respiratory distress. She has no rales.  Abdominal: Soft. Bowel sounds are normal. She exhibits no distension. There is tenderness in the right upper quadrant and epigastric area. There is no rigidity, no rebound and no guarding.  Musculoskeletal: Normal range of motion.  Neurological: She is alert and oriented to person, place, and time.  Skin: Skin is warm and dry. No rash noted.  Psychiatric: She has a normal mood and affect. Her behavior is normal.    ED Course  Procedures (including critical care time) Labs Review Labs Reviewed  CBC WITH DIFFERENTIAL - Abnormal; Notable for the following:    WBC 13.3 (*)    Neutrophils Relative % 89 (*)    Neutro Abs 11.9 (*)    Lymphocytes Relative 8 (*)    Monocytes Relative 2 (*)    All other components within normal limits  COMPREHENSIVE METABOLIC PANEL - Abnormal; Notable for the following:    Glucose, Bld 130 (*)    AST 41 (*)    GFR calc non Af Amer 84 (*)    All other components within normal limits  URINALYSIS, ROUTINE W REFLEX MICROSCOPIC - Abnormal; Notable for the following:    APPearance CLOUDY (*)    Leukocytes, UA SMALL (*)    All other components within normal limits  URINE MICROSCOPIC-ADD ON - Abnormal; Notable for the following:    Squamous Epithelial / LPF FEW (*)    All other components within normal limits  LIPASE, BLOOD   Imaging Review No results found.  EKG Interpretation   None       MDM   1. Fever   2. Vomiting   3. Abdominal pain    Nontoxic appearing 72 yo female with chills, fevers, vomiting, and RUQ abdominal pain.  Recent cholecystectomy.  Abdomen tender in RUQ and epigastrium without R/R/G.  BPs intermittently low and improved with cuff repositioning.  She reports a history of low BPs at baseline. Labs show leukocytosis.  CT and CXR pending.  Care transitioned to Dr. Gwendolyn Grant.      Candyce Churn, MD 02/10/13 [phone]

## 2013-02-10 ENCOUNTER — Emergency Department (HOSPITAL_COMMUNITY): Payer: Medicare Other

## 2013-02-10 DIAGNOSIS — R112 Nausea with vomiting, unspecified: Secondary | ICD-10-CM

## 2013-02-10 DIAGNOSIS — R509 Fever, unspecified: Secondary | ICD-10-CM | POA: Diagnosis present

## 2013-02-10 LAB — CBC
HCT: 35.9 % — ABNORMAL LOW (ref 36.0–46.0)
MCH: 30.9 pg (ref 26.0–34.0)
MCHC: 33.7 g/dL (ref 30.0–36.0)
MCV: 91.6 fL (ref 78.0–100.0)
Platelets: 221 10*3/uL (ref 150–400)
RBC: 3.92 MIL/uL (ref 3.87–5.11)
RDW: 13.7 % (ref 11.5–15.5)
WBC: 11.1 10*3/uL — ABNORMAL HIGH (ref 4.0–10.5)

## 2013-02-10 LAB — BASIC METABOLIC PANEL
BUN: 11 mg/dL (ref 6–23)
CO2: 24 mEq/L (ref 19–32)
Calcium: 8.9 mg/dL (ref 8.4–10.5)
Chloride: 103 mEq/L (ref 96–112)
Creatinine, Ser: 0.81 mg/dL (ref 0.50–1.10)
GFR calc Af Amer: 82 mL/min — ABNORMAL LOW (ref >90)

## 2013-02-10 MED ORDER — IOHEXOL 300 MG/ML  SOLN
100.0000 mL | Freq: Once | INTRAMUSCULAR | Status: AC | PRN
  Administered 2013-02-10: 100 mL via INTRAVENOUS

## 2013-02-10 MED ORDER — DEXTROSE 5 % IV SOLN
INTRAVENOUS | Status: AC
  Filled 2013-02-10: qty 10

## 2013-02-10 MED ORDER — LEVOTHYROXINE SODIUM 100 MCG PO TABS
100.0000 ug | ORAL_TABLET | Freq: Every day | ORAL | Status: DC
  Administered 2013-02-10 – 2013-02-11 (×2): 100 ug via ORAL
  Filled 2013-02-10 (×3): qty 1

## 2013-02-10 MED ORDER — ACETAMINOPHEN 650 MG RE SUPP
650.0000 mg | Freq: Four times a day (QID) | RECTAL | Status: DC | PRN
  Administered 2013-02-10: 650 mg via RECTAL
  Filled 2013-02-10: qty 1

## 2013-02-10 MED ORDER — HEPARIN SODIUM (PORCINE) 5000 UNIT/ML IJ SOLN
5000.0000 [IU] | Freq: Three times a day (TID) | INTRAMUSCULAR | Status: DC
  Administered 2013-02-10 – 2013-02-11 (×5): 5000 [IU] via SUBCUTANEOUS
  Filled 2013-02-10 (×7): qty 1

## 2013-02-10 MED ORDER — ONDANSETRON HCL 4 MG/2ML IJ SOLN
4.0000 mg | Freq: Four times a day (QID) | INTRAMUSCULAR | Status: DC | PRN
  Administered 2013-02-10: 4 mg via INTRAVENOUS
  Filled 2013-02-10: qty 2

## 2013-02-10 MED ORDER — ZOLPIDEM TARTRATE 5 MG PO TABS
5.0000 mg | ORAL_TABLET | Freq: Once | ORAL | Status: AC
  Administered 2013-02-10: 5 mg via ORAL
  Filled 2013-02-10: qty 1

## 2013-02-10 MED ORDER — SODIUM CHLORIDE 0.9 % IV BOLUS (SEPSIS)
1000.0000 mL | Freq: Once | INTRAVENOUS | Status: AC
  Administered 2013-02-09: 1000 mL via INTRAVENOUS

## 2013-02-10 MED ORDER — ACETAMINOPHEN 325 MG PO TABS
650.0000 mg | ORAL_TABLET | Freq: Four times a day (QID) | ORAL | Status: DC | PRN
  Administered 2013-02-10 (×2): 650 mg via ORAL
  Filled 2013-02-10 (×3): qty 2

## 2013-02-10 MED ORDER — DEXTROSE 5 % IV SOLN
1.0000 g | Freq: Every day | INTRAVENOUS | Status: DC
  Administered 2013-02-10: 03:00:00 via INTRAVENOUS
  Filled 2013-02-10 (×2): qty 10

## 2013-02-10 NOTE — Plan of Care (Signed)
Problem: Phase I Progression Outcomes Goal: OOB as tolerated unless otherwise ordered Outcome: Progressing Ambulating to the bathroom     

## 2013-02-10 NOTE — Progress Notes (Signed)
Pt arrived to floor room 1505 via stretcher. Walked to bed, steady gait. VS taken, pt oriented to room with no complications. 0/10 pain. General weakness. Initial assessment completed. Will continue to monitor through shift.

## 2013-02-10 NOTE — Progress Notes (Signed)
[facility] PROGRESS NOTE  Lori Webb ION:[id number] DOB: 1940-09-17 DOA: 02/09/2013 PCP: [name], MD  Brief narrative: Lori Webb is an 72 y.o. female with a PMH of diverticulosis, recent cholecystitis s/p cholecystectomy 12/14/12, who was admitted earlier today with fever, N/V and RUQ pain.  Upon initial evaluation in the ED, a CT was done which was negative for diverticulitis.  Lipase and LFTs were unremarkable.  U/A had 7-10 WBC, no nitrites, but patient without any complaints of frequency, urgency or dysuria.  Given recent cholecystitis, admitted for further observation.  Assessment/Plan: Principal Problem:   Fever / Possible gastroenteritis.   Given recent cholecystitis, CT abdomen and pelvis done to rule out post-operative complication.  No evidence of abnormal fluid collections or abscess.  The patient has been placed on empiric Rocephin for suspected UTI.  Still spiking fevers.  WBC elevated, but coming down on empiric antibiotics.  No complaints of diarrhea.  N/V improved.  Would continue to monitor until urine cultures back and fever breaks.  Although the patient has not had any known sick contacts, she interacts with the public and may have been exposed to a viral gastroenteritis.  F/U BCUX, urine culture. Active Problems:   Hypothyroid Continue synthroid.   Nausea with vomiting Continue supportive care with IVF, anti-emetics. Currently on FL diet.  Advance as tolerated.   Code Status: Full. Family Communication: No family at the bedside. Disposition Plan: Home when stable.   IV access:  PIV  Medical Consultants:  None.  Other Consultants:  None.  Anti-infectives:  Rocephin 02/10/13--->   HPI/Subjective: Lori Webb has not had any N/V today.  No complaints of diarrhea.  Has some RUQ soreness.  No dysuria or frequency/urgency.  Objective: Filed Vitals:   02/10/13 0530 02/10/13 0851 02/10/13 1154 02/10/13 1332  BP: 109/57   93/54   Pulse: 78   88  Temp: 101.9 F (38.8 C) 100.2 F (37.9 C) 99.1 F (37.3 C) 99 F (37.2 C)  TempSrc: Oral Oral Oral Oral  Resp: 20   20  Height:      Weight:      SpO2: 99%   100%    Intake/Output Summary (Last 24 hours) at 02/10/13 1506 Last data filed at 02/10/13 1000  Gross per 24 hour  Intake    477 ml  Output    340 ml  Net    137 ml    Exam: Gen:  NAD Cardiovascular:  RRR, No M/R/G Respiratory:  Lungs CTAB Gastrointestinal:  Abdomen soft, mild tenderness RUQ, no rebound or guarding, hyperactive BS. Extremities:  No C/E/C  Data Reviewed: Basic Metabolic Panel:  Recent Labs Lab 02/09/13 2131 02/10/13 0300  NA 135 136  K 3.7 3.6  CL 99 103  CO2 22 24  GLUCOSE 130* 109*  BUN 14 11  CREATININE 0.72 0.81  CALCIUM 9.7 8.9   GFR Estimated Creatinine Clearance: 57 ml/min (by C-G formula based on Cr of 0.81). Liver Function Tests:  Recent Labs Lab 02/09/13 2131  AST 41*  ALT 34  ALKPHOS 105  BILITOT 0.8  PROT 7.0  ALBUMIN 3.7    Recent Labs Lab 02/09/13 2131  LIPASE 19   CBC:  Recent Labs Lab 02/09/13 2131 02/10/13 0300  WBC 13.3* 11.1*  NEUTROABS 11.9*  --   HGB 14.0 12.1  HCT 40.8 35.9*  MCV 91.9 91.6  PLT 258 221   Microbiology No results found for this or any previous visit (from the past 240  hour(s)).   Procedures and Diagnostic Studies: Ct Abdomen Pelvis W Contrast  02/10/2013   CLINICAL DATA:  Nausea, vomited, diarrhea and chills for 2 days  EXAM: CT ABDOMEN AND PELVIS WITH CONTRAST  TECHNIQUE: Multidetector CT imaging of the abdomen and pelvis was performed using the standard protocol following bolus administration of intravenous contrast. Sagittal and coronal MPR images reconstructed from axial data set.  CONTRAST:  Dilute oral contrast. 100 cc Omnipaque 300 IV  COMPARISON:  11/21/2012  FINDINGS: Lung bases clear.  Post cholecystectomy.  Liver, spleen, pancreas, kidneys, and adrenal glands normal appearance.  Scattered sigmoid  diverticula without evidence of diverticulitis.  Stomach and bowel loops normal appearance.  Unremarkable bladder andureters.  Uterus surgically absent with normal sized ovaries.  Appendix not definitely visualized.  No mass, adenopathy, free fluid or inflammatory process.  No acute osseous findings.  IMPRESSION: Post cholecystectomy.  Distal colonic diverticulosis without evidence of diverticulitis.  No other intra-abdominal or intrapelvic abnormalities identified.   Electronically Signed   By: Ulyses Southward M.D.   On: 02/10/2013 01:40   Dg Chest Port 1 View  02/10/2013   CLINICAL DATA:  Chills, body aches, fever, abdominal pain  EXAM: PORTABLE CHEST - 1 VIEW  COMPARISON:  Portable exam 0042 hr compared to 12/24/2008  FINDINGS: Normal heart size, mediastinal contours, and pulmonary vascularity.  Lungs clear.  No pleural effusion or pneumothorax.  Bones appear demineralized.  IMPRESSION: No acute abnormalities.   Electronically Signed   By: Ulyses Southward M.D.   On: 02/10/2013 01:27    Scheduled Meds: . cefTRIAXone (ROCEPHIN)  IV  1 g Intravenous QHS  . heparin  5,000 Units Subcutaneous Q8H  . levothyroxine  100 mcg Oral QAC breakfast   Continuous Infusions:   Time spent: 30 minutes.   LOS: 1 day   [name],[name]  [facility] Pager [phone].   *Please note that the hospitalists switch teams on Wednesdays. Please call the flow manager at [phone] if you are having difficulty reaching the hospitalist taking care of this patient as she can update you and provide the most up-to-date pager number of provider caring for the patient. If 8PM-8AM, please contact night-coverage at [email or web address], password [email or web address]  02/10/2013, 3:06 PM

## 2013-02-10 NOTE — Progress Notes (Signed)
Pt states her headache is feeling better. States she feels a little nauseated at times. Sleeping at intervals.

## 2013-02-10 NOTE — Plan of Care (Signed)
Problem: Phase I Progression Outcomes Goal: Pain controlled with appropriate interventions Outcome: Progressing C/o headache, medicated with Tylenol.

## 2013-02-10 NOTE — Progress Notes (Signed)
Utilization Review completed.  

## 2013-02-10 NOTE — H&P (Signed)
[facility] History and Physical  Lori Webb ZOX:[id number] DOB: 06-17-1940 DOA: 02/09/2013  Referring physician: ED PCP: [name], MD   Chief Complaint: Fever  HPI: Lori Webb is a 72 y.o. female who presents to the ED with c/o Fever and RUQ abdominal pain.  There is also associated N/V.  Symptoms onset earlier this morning and have been persistent.  Patient states the pain feels almost exactly like it did when she had her gall bladder removed earlier this year (had choledocholithiasis at that time).  Has had a slight increase in urinary frequency but no dysuria.  Review of Systems: 12 systems reviewed and otherwise negative.  Past Medical History  Diagnosis Date  . Diverticulosis   . Anxiety   . Cholelithiasis with obstruction 12/12/2012  . Hypothyroid 12/12/2012    S/p thyroidectomy for Goiter.  Marland Kitchen Hx of diverticulitis of colon 12/12/2012    11/21/12 first diagnosis of this.   Past Surgical History  Procedure Laterality Date  . Cesarean section      x2  . Thyroidectomy    . Tonsillectomy    . Appendectomy    . Abdominal hysterectomy    . Dilation and curettage of uterus    . Plantar fascia release  2009    left heel  . Colonoscopy    . Mass excision  05/02/2012    Procedure: EXCISION MASS;  Surgeon: Shelly Rubenstein, MD;  Location: [facility];  Service: General;  Laterality: Right;  excision cyst right upper back  . Ercp N/A 12/13/2012    Procedure: ENDOSCOPIC RETROGRADE CHOLANGIOPANCREATOGRAPHY (ERCP);  Surgeon: Iva Boop, MD;  Location: [facility];  Service: Endoscopy;  Laterality: N/A;  . Ercp N/A 12/16/2012    Procedure: ENDOSCOPIC RETROGRADE CHOLANGIOPANCREATOGRAPHY (ERCP);  Surgeon: Iva Boop, MD;  Location: [facility];  Service: Gastroenterology;  Laterality: N/A;  . Cholecystectomy N/A 12/14/2012    Procedure: LAPAROSCOPIC CHOLECYSTECTOMY WITH INTRAOPERATIVE CHOLANGIOGRAM;  Surgeon: Adolph Pollack, MD;  Location: [facility];   Service: General;  Laterality: N/A;   Social History:  reports that she has never smoked. She has never used smokeless tobacco. She reports that she drinks about 0.6 ounces of alcohol per week. She reports that she does not use illicit drugs.  No Known Allergies  Family History  Problem Relation Age of Onset  . Cancer Mother     breast  . Cancer Maternal Grandmother     breast    Prior to Admission medications   Medication Sig Start Date End Date Taking? Authorizing Provider  levothyroxine (SYNTHROID, LEVOTHROID) 100 MCG tablet Take 100 mcg by mouth daily.   Yes Historical Provider, MD   Physical Exam: Filed Vitals:   02/10/13 0223  BP: 115/66  Pulse: 70  Temp: 98.9 F (37.2 C)  Resp: 22     General:  NAD, resting comfortably in bed  Eyes: PEERLA EOMI  ENT: mucous membranes moist  Neck: supple w/o JVD  Cardiovascular: RRR w/o MRG  Respiratory: CTA B  Abdomen: soft, ruq tenderness and epigastric area, nd, bs+  Skin: no rash nor lesion  Musculoskeletal: MAE, full ROM all 4 extremities  Psychiatric: normal tone and affect  Neurologic: AAOx3, grossly non-focal   Labs on Admission:  Basic Metabolic Panel:  Recent Labs Lab 02/09/13 2131  NA 135  K 3.7  CL 99  CO2 22  GLUCOSE 130*  BUN 14  CREATININE 0.72  CALCIUM 9.7   Liver Function Tests:  Recent Labs  Lab 02/09/13 2131  AST 41*  ALT 34  ALKPHOS 105  BILITOT 0.8  PROT 7.0  ALBUMIN 3.7    Recent Labs Lab 02/09/13 2131  LIPASE 19   No results found for this basename: AMMONIA,  in the last 168 hours CBC:  Recent Labs Lab 02/09/13 2131  WBC 13.3*  NEUTROABS 11.9*  HGB 14.0  HCT 40.8  MCV 91.9  PLT 258   Cardiac Enzymes: No results found for this basename: CKTOTAL, CKMB, CKMBINDEX, TROPONINI,  in the last 168 hours  BNP (last 3 results) No results found for this basename: PROBNP,  in the last 8760 hours CBG: No results found for this basename: GLUCAP,  in the last 168  hours  Radiological Exams on Admission: Ct Abdomen Pelvis W Contrast  02/10/2013   CLINICAL DATA:  Nausea, vomited, diarrhea and chills for 2 days  EXAM: CT ABDOMEN AND PELVIS WITH CONTRAST  TECHNIQUE: Multidetector CT imaging of the abdomen and pelvis was performed using the standard protocol following bolus administration of intravenous contrast. Sagittal and coronal MPR images reconstructed from axial data set.  CONTRAST:  Dilute oral contrast. 100 cc Omnipaque 300 IV  COMPARISON:  11/21/2012  FINDINGS: Lung bases clear.  Post cholecystectomy.  Liver, spleen, pancreas, kidneys, and adrenal glands normal appearance.  Scattered sigmoid diverticula without evidence of diverticulitis.  Stomach and bowel loops normal appearance.  Unremarkable bladder andureters.  Uterus surgically absent with normal sized ovaries.  Appendix not definitely visualized.  No mass, adenopathy, free fluid or inflammatory process.  No acute osseous findings.  IMPRESSION: Post cholecystectomy.  Distal colonic diverticulosis without evidence of diverticulitis.  No other intra-abdominal or intrapelvic abnormalities identified.   Electronically Signed   By: Ulyses Southward M.D.   On: 02/10/2013 01:40   Dg Chest Port 1 View  02/10/2013   CLINICAL DATA:  Chills, body aches, fever, abdominal pain  EXAM: PORTABLE CHEST - 1 VIEW  COMPARISON:  Portable exam 0042 hr compared to 12/24/2008  FINDINGS: Normal heart size, mediastinal contours, and pulmonary vascularity.  Lungs clear.  No pleural effusion or pneumothorax.  Bones appear demineralized.  IMPRESSION: No acute abnormalities.   Electronically Signed   By: Ulyses Southward M.D.   On: 02/10/2013 01:27    EKG: Independently reviewed.   Assessment/Plan Principal Problem:   Fever Active Problems:   Nausea with vomiting   1. Fever, nausea and vomiting - unclear source for fever, could be viral gastritis.  ED started treatment for UA although urine unimpressive.  Will continue rocephin for now  as no other source is obvious at this time.  Highly doubt ascending cholangitis as patient is non-toxic appearing and no where near sick enough at this point, also patients normal LFTs dont really support this.  Lipase also normal.  Blood and urine cultures pending.    Code Status: Full Code (must indicate code status--if unknown or must be presumed, indicate so) Family Communication: Husband at bedside (indicate person spoken with, if applicable, with phone number if by telephone) Disposition Plan: Admit to inpatient (indicate anticipated LOS)  Time spent: 70 min  GARDNER, JARED M. [facility] Pager [phone]  If 7PM-7AM, please contact night-coverage [email or web address] Password [facility] 02/10/2013, 2:39 AM

## 2013-02-11 DIAGNOSIS — R509 Fever, unspecified: Secondary | ICD-10-CM | POA: Diagnosis not present

## 2013-02-11 DIAGNOSIS — E039 Hypothyroidism, unspecified: Secondary | ICD-10-CM | POA: Diagnosis not present

## 2013-02-11 DIAGNOSIS — N390 Urinary tract infection, site not specified: Secondary | ICD-10-CM | POA: Diagnosis not present

## 2013-02-11 LAB — CBC
HCT: 37.5 % (ref 36.0–46.0)
MCH: 30.7 pg (ref 26.0–34.0)
MCHC: 32.5 g/dL (ref 30.0–36.0)
MCV: 94.2 fL (ref 78.0–100.0)
Platelets: 207 10*3/uL (ref 150–400)
RDW: 13.9 % (ref 11.5–15.5)

## 2013-02-11 LAB — COMPREHENSIVE METABOLIC PANEL
AST: 45 U/L — ABNORMAL HIGH (ref 0–37)
Albumin: 2.9 g/dL — ABNORMAL LOW (ref 3.5–5.2)
BUN: 7 mg/dL (ref 6–23)
Calcium: 8.7 mg/dL (ref 8.4–10.5)
Creatinine, Ser: 0.73 mg/dL (ref 0.50–1.10)
Total Protein: 5.9 g/dL — ABNORMAL LOW (ref 6.0–8.3)

## 2013-02-11 MED ORDER — CIPROFLOXACIN HCL 500 MG PO TABS: 500.0000 mg | ORAL_TABLET | Freq: Two times a day (BID) | ORAL | Status: DC

## 2013-02-11 NOTE — Discharge Summary (Signed)
Physician Discharge Summary  Lori Webb:[id number] DOB: 23-Mar-1941 DOA: 02/09/2013  PCP: [name], MD  Admit date: 02/09/2013 Discharge date: 02/11/2013  Time spent: 25 minutes  Recommendations for Outpatient Follow-up:  1. Patient is being discharged in a three-day course of Cipro. 2. Patient will follow up with her primary care physician in the next few weeks  Discharge Diagnoses:  Principal Problem:   Fever Active Problems:   Hypothyroid   Nausea with vomiting   UTI (urinary tract infection)   Discharge Condition: Improved, being discharged home  Diet recommendation: Regular  Filed Weights   02/10/13 0223  Weight: 65.182 kg (143 lb 11.2 oz)    History of present illness:  Patient is a 72 year old white female who presented with complaints of fever and right upper quadrant abdominal pain with some associated nausea and vomiting. There she documented temperatures in the emergency room of 101. Patient had her gallbladder removed earlier this year as well as noticing slight increase in urinary frequency. She was admitted to the hospital service for further evaluation.  Hospital Course:  Principal Problem:   Fever: Given recent cholecystitis and remove her gallbladder, CT abdomen and pelvis was done to rule out postop complication. There is no evidence of abnormal fluid collection or abscess. Patient's urinalysis noted some moderate white cells but otherwise unimpressive. I suspected that she had a large UTI which was slightly buffered by antibiotics received her surgery. Patient responded well to IV Rocephin and by 11/3, her white count was normalized and she was feeling much better with no further fevers. When discharge patient on the 3 days of Cipro. Blood cultures have remained negative to date. Urine cultures unfortunately because of IV antibiotics ulcer showing no bacteria cultured.  At this point, patient feeling better, tolerating by mouth, no fevers and plan  to discharge home with follow up with her primary care physician. Active Problems:   Hypothyroid: Stable. Continue Synthroid   Nausea with vomiting: Was secondary to infection, resolved. See above   UTI (urinary tract infection): See above   Procedures:  None  Consultations:  None  Discharge Exam: Filed Vitals:   02/11/13 1347  BP: 115/65  Pulse: 58  Temp: 98.5 F (36.9 C)  Resp: 18    General: Alert and oriented x3, no acute distress Cardiovascular: Regular rate and rhythm, S1-S2 Respiratory: Clear auscultation bilaterally Abdomen: Soft, nontender, nondistended, positive bowel sounds Extremities: No clubbing cyanosis or edema  Discharge Instructions  Discharge Orders   Future Orders Complete By Expires   Diet general  As directed    Increase activity slowly  As directed        Medication List         ciprofloxacin 500 MG tablet  Commonly known as:  CIPRO  Take 1 tablet (500 mg total) by mouth 2 (two) times daily.     levothyroxine 100 MCG tablet  Commonly known as:  SYNTHROID, LEVOTHROID  Take 100 mcg by mouth daily.       No Known Allergies     Follow-up Information   Follow up with [name], MD In 2 weeks.   Specialty:  Family Medicine   Contact information:   [address] [place] [address] [phone]        The results of significant diagnostics from this hospitalization (including imaging, microbiology, ancillary and laboratory) are listed below for reference.    Significant Diagnostic Studies: Ct Abdomen Pelvis W Contrast  02/10/2013   CLINICAL  DATA:  Nausea, vomited, diarrhea and chills for 2 days  EXAM: CT ABDOMEN AND PELVIS WITH CONTRAST  TECHNIQUE: Multidetector CT imaging of the abdomen and pelvis was performed using the standard protocol following bolus administration of intravenous contrast. Sagittal and coronal MPR images reconstructed from axial data set.  CONTRAST:  Dilute oral contrast. 100 cc  Omnipaque 300 IV  COMPARISON:  11/21/2012  FINDINGS: Lung bases clear.  Post cholecystectomy.  Liver, spleen, pancreas, kidneys, and adrenal glands normal appearance.  Scattered sigmoid diverticula without evidence of diverticulitis.  Stomach and bowel loops normal appearance.  Unremarkable bladder andureters.  Uterus surgically absent with normal sized ovaries.  Appendix not definitely visualized.  No mass, adenopathy, free fluid or inflammatory process.  No acute osseous findings.  IMPRESSION: Post cholecystectomy.  Distal colonic diverticulosis without evidence of diverticulitis.  No other intra-abdominal or intrapelvic abnormalities identified.   Electronically Signed   By: Ulyses Southward M.D.   On: 02/10/2013 01:40   Dg Chest Port 1 View  02/10/2013   CLINICAL DATA:  Chills, body aches, fever, abdominal pain  EXAM: PORTABLE CHEST - 1 VIEW  COMPARISON:  Portable exam 0042 hr compared to 12/24/2008  FINDINGS: Normal heart size, mediastinal contours, and pulmonary vascularity.  Lungs clear.  No pleural effusion or pneumothorax.  Bones appear demineralized.  IMPRESSION: No acute abnormalities.   Electronically Signed   By: Ulyses Southward M.D.   On: 02/10/2013 01:27    Microbiology: Recent Results (from the past 240 hour(s))  CULTURE, BLOOD (ROUTINE X 2)     Status: None   Collection Time    02/10/13  3:00 AM      Result Value Range Status   Specimen Description BLOOD RIGHT ARM   Final   Special Requests BOTTLES DRAWN AEROBIC AND ANAEROBIC 5CC   Final   Culture  Setup Time     Final   Value: 02/10/2013 13:51     Performed at [facility]   Culture     Final   Value:        BLOOD CULTURE RECEIVED NO GROWTH TO DATE CULTURE WILL BE HELD FOR 5 DAYS BEFORE ISSUING A FINAL NEGATIVE REPORT     Performed at [facility]   Report Status PENDING   Incomplete  CULTURE, BLOOD (ROUTINE X 2)     Status: None   Collection Time    02/10/13  3:05 AM      Result Value Range Status   Specimen  Description BLOOD RIGHT ARM   Final   Special Requests BOTTLES DRAWN AEROBIC AND ANAEROBIC 5CC   Final   Culture  Setup Time     Final   Value: 02/10/2013 13:51     Performed at [facility]   Culture     Final   Value:        BLOOD CULTURE RECEIVED NO GROWTH TO DATE CULTURE WILL BE HELD FOR 5 DAYS BEFORE ISSUING A FINAL NEGATIVE REPORT     Performed at [facility]   Report Status PENDING   Incomplete     Labs: Basic Metabolic Panel:  Recent Labs Lab 02/09/13 2131 02/10/13 0300 02/11/13 0945  NA 135 136 137  K 3.7 3.6 3.5  CL 99 103 104  CO2 22 24 27   GLUCOSE 130* 109* 103*  BUN 14 11 7   CREATININE 0.72 0.81 0.73  CALCIUM 9.7 8.9 8.7   Liver Function Tests:  Recent Labs Lab 02/09/13 2131 02/11/13 0945  AST 41* 45*  ALT 34 54*  ALKPHOS 105 95  BILITOT 0.8 0.2*  PROT 7.0 5.9*  ALBUMIN 3.7 2.9*    Recent Labs Lab 02/09/13 2131  LIPASE 19   No results found for this basename: AMMONIA,  in the last 168 hours CBC:  Recent Labs Lab 02/09/13 2131 02/10/13 0300 02/11/13 0945  WBC 13.3* 11.1* 4.6  NEUTROABS 11.9*  --   --   HGB 14.0 12.1 12.2  HCT 40.8 35.9* 37.5  MCV 91.9 91.6 94.2  PLT 258 221 207     Signed:  Fenix Ruppe K  [facility] 02/11/2013, 3:50 PM

## 2013-02-11 NOTE — Progress Notes (Signed)
Patient declined signing up for My Chart, has no interest at this time

## 2013-02-16 LAB — CULTURE, BLOOD (ROUTINE X 2): Culture: NO GROWTH

## 2013-03-04 DIAGNOSIS — N390 Urinary tract infection, site not specified: Secondary | ICD-10-CM | POA: Diagnosis not present

## 2013-03-04 DIAGNOSIS — R748 Abnormal levels of other serum enzymes: Secondary | ICD-10-CM | POA: Diagnosis not present

## 2013-03-04 DIAGNOSIS — Z1231 Encounter for screening mammogram for malignant neoplasm of breast: Secondary | ICD-10-CM | POA: Diagnosis not present

## 2013-03-04 DIAGNOSIS — E782 Mixed hyperlipidemia: Secondary | ICD-10-CM | POA: Diagnosis not present

## 2013-05-20 ENCOUNTER — Other Ambulatory Visit: Payer: Self-pay | Admitting: Family Medicine

## 2013-05-20 ENCOUNTER — Ambulatory Visit
Admission: RE | Admit: 2013-05-20 | Discharge: 2013-05-20 | Disposition: A | Discharge status: Home / Self Care | Payer: Medicare Other | Source: Ambulatory Visit | Attending: Family Medicine | Admitting: Family Medicine

## 2013-05-20 DIAGNOSIS — R109 Unspecified abdominal pain: Secondary | ICD-10-CM

## 2013-05-20 DIAGNOSIS — K5289 Other specified noninfective gastroenteritis and colitis: Secondary | ICD-10-CM | POA: Diagnosis not present

## 2013-05-20 MED ORDER — IOHEXOL 300 MG/ML  SOLN
100.0000 mL | Freq: Once | INTRAMUSCULAR | Status: AC | PRN
  Administered 2013-05-20: 100 mL via INTRAVENOUS

## 2013-06-16 ENCOUNTER — Emergency Department (HOSPITAL_COMMUNITY): Payer: Medicare Other

## 2013-06-16 ENCOUNTER — Encounter (HOSPITAL_COMMUNITY): Payer: Self-pay | Admitting: Emergency Medicine

## 2013-06-16 ENCOUNTER — Emergency Department (HOSPITAL_COMMUNITY)
Admission: EM | Admit: 2013-06-16 | Discharge: 2013-06-17 | Disposition: A | Discharge status: Home / Self Care | Payer: Medicare Other | Attending: Emergency Medicine | Admitting: Emergency Medicine

## 2013-06-16 DIAGNOSIS — T628X4A Toxic effect of other specified noxious substances eaten as food, undetermined, initial encounter: Secondary | ICD-10-CM | POA: Insufficient documentation

## 2013-06-16 DIAGNOSIS — Z90710 Acquired absence of both cervix and uterus: Secondary | ICD-10-CM | POA: Diagnosis not present

## 2013-06-16 DIAGNOSIS — E039 Hypothyroidism, unspecified: Secondary | ICD-10-CM | POA: Insufficient documentation

## 2013-06-16 DIAGNOSIS — Z8659 Personal history of other mental and behavioral disorders: Secondary | ICD-10-CM | POA: Diagnosis not present

## 2013-06-16 DIAGNOSIS — T628X1A Toxic effect of other specified noxious substances eaten as food, accidental (unintentional), initial encounter: Secondary | ICD-10-CM | POA: Diagnosis not present

## 2013-06-16 DIAGNOSIS — Y929 Unspecified place or not applicable: Secondary | ICD-10-CM | POA: Insufficient documentation

## 2013-06-16 DIAGNOSIS — Z8719 Personal history of other diseases of the digestive system: Secondary | ICD-10-CM | POA: Insufficient documentation

## 2013-06-16 DIAGNOSIS — Z79899 Other long term (current) drug therapy: Secondary | ICD-10-CM | POA: Insufficient documentation

## 2013-06-16 DIAGNOSIS — Z9889 Other specified postprocedural states: Secondary | ICD-10-CM | POA: Insufficient documentation

## 2013-06-16 DIAGNOSIS — K529 Noninfective gastroenteritis and colitis, unspecified: Secondary | ICD-10-CM

## 2013-06-16 DIAGNOSIS — K8050 Calculus of bile duct without cholangitis or cholecystitis without obstruction: Secondary | ICD-10-CM | POA: Diagnosis not present

## 2013-06-16 DIAGNOSIS — Z9089 Acquired absence of other organs: Secondary | ICD-10-CM | POA: Insufficient documentation

## 2013-06-16 DIAGNOSIS — K5289 Other specified noninfective gastroenteritis and colitis: Secondary | ICD-10-CM | POA: Diagnosis not present

## 2013-06-16 DIAGNOSIS — R109 Unspecified abdominal pain: Secondary | ICD-10-CM | POA: Insufficient documentation

## 2013-06-16 DIAGNOSIS — A059 Bacterial foodborne intoxication, unspecified: Secondary | ICD-10-CM

## 2013-06-16 DIAGNOSIS — Y9389 Activity, other specified: Secondary | ICD-10-CM | POA: Insufficient documentation

## 2013-06-16 LAB — CBC WITH DIFFERENTIAL/PLATELET
Basophils Absolute: 0 10*3/uL (ref 0.0–0.1)
Basophils Relative: 0 % (ref 0–1)
Eosinophils Absolute: 0 10*3/uL (ref 0.0–0.7)
Eosinophils Relative: 0 % (ref 0–5)
HCT: 39.9 % (ref 36.0–46.0)
Hemoglobin: 13.3 g/dL (ref 12.0–15.0)
Lymphocytes Relative: 8 % — ABNORMAL LOW (ref 12–46)
Lymphs Abs: 0.7 10*3/uL (ref 0.7–4.0)
MCH: 30.6 pg (ref 26.0–34.0)
MCHC: 33.3 g/dL (ref 30.0–36.0)
MCV: 91.9 fL (ref 78.0–100.0)
Monocytes Absolute: 0.3 10*3/uL (ref 0.1–1.0)
Monocytes Relative: 4 % (ref 3–12)
Neutro Abs: 8.2 10*3/uL — ABNORMAL HIGH (ref 1.7–7.7)
Neutrophils Relative %: 89 % — ABNORMAL HIGH (ref 43–77)
Platelets: 227 10*3/uL (ref 150–400)
RBC: 4.34 MIL/uL (ref 3.87–5.11)
RDW: 13.6 % (ref 11.5–15.5)
WBC: 9.3 10*3/uL (ref 4.0–10.5)

## 2013-06-16 LAB — COMPREHENSIVE METABOLIC PANEL
ALT: 21 U/L (ref 0–35)
AST: 26 U/L (ref 0–37)
Albumin: 3.6 g/dL (ref 3.5–5.2)
Alkaline Phosphatase: 89 U/L (ref 39–117)
BUN: 19 mg/dL (ref 6–23)
CO2: 24 mEq/L (ref 19–32)
Calcium: 9 mg/dL (ref 8.4–10.5)
Chloride: 103 mEq/L (ref 96–112)
Creatinine, Ser: 0.8 mg/dL (ref 0.50–1.10)
GFR calc Af Amer: 83 mL/min — ABNORMAL LOW (ref >90)
GFR calc non Af Amer: 72 mL/min — ABNORMAL LOW (ref >90)
Glucose, Bld: 109 mg/dL — ABNORMAL HIGH (ref 70–99)
Potassium: 3.6 mEq/L — ABNORMAL LOW (ref 3.7–5.3)
Sodium: 143 mEq/L (ref 137–147)
Total Bilirubin: 0.5 mg/dL (ref 0.3–1.2)
Total Protein: 6.7 g/dL (ref 6.0–8.3)

## 2013-06-16 LAB — URINALYSIS, ROUTINE W REFLEX MICROSCOPIC
Bilirubin Urine: NEGATIVE
Glucose, UA: NEGATIVE mg/dL
Hgb urine dipstick: NEGATIVE
Ketones, ur: NEGATIVE mg/dL
Leukocytes, UA: NEGATIVE
Nitrite: NEGATIVE
Protein, ur: NEGATIVE mg/dL
Specific Gravity, Urine: 1.037 — ABNORMAL HIGH (ref 1.005–1.030)
Urobilinogen, UA: 1 mg/dL (ref 0.0–1.0)
pH: 7 (ref 5.0–8.0)

## 2013-06-16 LAB — LIPASE, BLOOD: Lipase: 12 U/L (ref 11–59)

## 2013-06-16 MED ORDER — IOHEXOL 300 MG/ML  SOLN
50.0000 mL | Freq: Once | INTRAMUSCULAR | Status: AC | PRN
  Administered 2013-06-16: 50 mL via ORAL

## 2013-06-16 MED ORDER — SODIUM CHLORIDE 0.9 % IV BOLUS (SEPSIS)
1000.0000 mL | Freq: Once | INTRAVENOUS | Status: AC
  Administered 2013-06-16: 1000 mL via INTRAVENOUS

## 2013-06-16 MED ORDER — ONDANSETRON HCL 4 MG/2ML IJ SOLN
4.0000 mg | Freq: Once | INTRAMUSCULAR | Status: AC
  Administered 2013-06-16: 4 mg via INTRAVENOUS
  Filled 2013-06-16: qty 2

## 2013-06-16 MED ORDER — IOHEXOL 300 MG/ML  SOLN
100.0000 mL | Freq: Once | INTRAMUSCULAR | Status: AC | PRN
  Administered 2013-06-16: 100 mL via INTRAVENOUS

## 2013-06-16 MED ORDER — MORPHINE SULFATE 4 MG/ML IJ SOLN
4.0000 mg | Freq: Once | INTRAMUSCULAR | Status: AC
  Administered 2013-06-16: 4 mg via INTRAVENOUS
  Filled 2013-06-16: qty 1

## 2013-06-16 MED ORDER — PROMETHAZINE HCL 25 MG/ML IJ SOLN
12.5000 mg | Freq: Once | INTRAMUSCULAR | Status: AC
  Administered 2013-06-16: 12.5 mg via INTRAVENOUS
  Filled 2013-06-16: qty 1

## 2013-06-16 NOTE — ED Notes (Signed)
She states she has vomited several times since yesterday evening.  She denies diarrhea and states she feels "real bad".  She is in no distress.

## 2013-06-16 NOTE — ED Provider Notes (Signed)
Patient had multiple episodes of vomiting, look like plantar she is today onset began last night accompanied by diffuse abdominal pain. She treated self with Zofran without relief. On exam patient is alert mildly ill-appearing nontoxic Glasgow Coma Score 15. Lungs clear auscultation heart regular  rhythm abdomen multiple surgical scars normal bowel sounds diffusely tender with voluntary guarding  Orlie Dakin, MD 06/16/13 4497

## 2013-06-17 MED ORDER — PROMETHAZINE HCL 25 MG PO TABS: 25.0000 mg | ORAL_TABLET | Freq: Three times a day (TID) | ORAL | Status: DC | PRN

## 2013-06-17 MED ORDER — PROMETHAZINE HCL 25 MG RE SUPP: 25.0000 mg | Freq: Four times a day (QID) | RECTAL | Status: DC | PRN

## 2013-06-17 NOTE — Discharge Instructions (Signed)
Return here as needed.  Followup with your primary care Dr. slowly increase your fluid intake

## 2013-06-17 NOTE — ED Provider Notes (Signed)
CSN: [id number]     Arrival date & time 06/16/13  1712 History   First MD Initiated Contact with Patient 06/16/13 1725     Chief Complaint  Patient presents with  . Emesis     (Consider location/radiation/quality/duration/timing/severity/associated sxs/prior Treatment) HPI Patient presents to the emergency department with nausea and vomiting.  For the last 12 hours.  Patient, states, that she ate some clam chowder that was homemade.  She states that it is probably a few days past being good.  Patient denies chest pain, shortness of breath, dizziness, fever, headache, blurred vision, back pain, dysuria, neck pain, cough, or syncope.  The patient states, that she did not take any medications prior to arrival. Past Medical History  Diagnosis Date  . Diverticulosis   . Anxiety   . Cholelithiasis with obstruction 12/12/2012  . Hypothyroid 12/12/2012    S/p thyroidectomy for Goiter.  Marland Kitchen Hx of diverticulitis of colon 12/12/2012    11/21/12 first diagnosis of this.   Past Surgical History  Procedure Laterality Date  . Cesarean section      x2  . Thyroidectomy    . Tonsillectomy    . Appendectomy    . Abdominal hysterectomy    . Dilation and curettage of uterus    . Plantar fascia release  2009    left heel  . Colonoscopy    . Mass excision  05/02/2012    Procedure: EXCISION MASS;  Surgeon: Harl Bowie, MD;  Location: [facility];  Service: General;  Laterality: Right;  excision cyst right upper back  . Ercp N/A 12/13/2012    Procedure: ENDOSCOPIC RETROGRADE CHOLANGIOPANCREATOGRAPHY (ERCP);  Surgeon: Gatha Mayer, MD;  Location: [facility];  Service: Endoscopy;  Laterality: N/A;  . Ercp N/A 12/16/2012    Procedure: ENDOSCOPIC RETROGRADE CHOLANGIOPANCREATOGRAPHY (ERCP);  Surgeon: Gatha Mayer, MD;  Location: [facility];  Service: Gastroenterology;  Laterality: N/A;  . Cholecystectomy N/A 12/14/2012    Procedure: LAPAROSCOPIC CHOLECYSTECTOMY WITH INTRAOPERATIVE CHOLANGIOGRAM;   Surgeon: Odis Hollingshead, MD;  Location: [facility];  Service: General;  Laterality: N/A;   Family History  Problem Relation Age of Onset  . Cancer Mother     breast  . Cancer Maternal Grandmother     breast   History  Substance Use Topics  . Smoking status: Never Smoker   . Smokeless tobacco: Never Used  . Alcohol Use: 0.6 oz/week    1 Glasses of wine per week     Comment: 1 glass of wine per night   OB History   Grav Para Term Preterm Abortions TAB SAB Ect Mult Living                 Review of Systems  All other systems negative except as documented in the HPI. All pertinent positives and negatives as reviewed in the HPI.  Allergies  Review of patient's allergies indicates no known allergies.  Home Medications   Current Outpatient Rx  Name  Route  Sig  Dispense  Refill  . levothyroxine (SYNTHROID, LEVOTHROID) 100 MCG tablet   Oral   Take 100 mcg by mouth daily before breakfast.           BP 112/40  Pulse 78  Temp(Src) 99.7 F (37.6 C) (Oral)  Resp 16  Ht 5\' 3"  (1.6 m)  Wt 152 lb (68.947 kg)  BMI 26.93 kg/m2  SpO2 95% Physical Exam  Nursing note and vitals reviewed. Constitutional: She is oriented to person, place,  and time. She appears well-developed and well-nourished. No distress.  HENT:  Head: Normocephalic and atraumatic.  Mouth/Throat: Uvula is midline. Mucous membranes are dry.  Eyes: Pupils are equal, round, and reactive to light.  Cardiovascular: Normal rate, regular rhythm and normal heart sounds.   Pulmonary/Chest: Effort normal and breath sounds normal. No respiratory distress.  Abdominal: Soft. Bowel sounds are normal. She exhibits no distension. There is tenderness. There is guarding. There is no rebound.  Neurological: She is alert and oriented to person, place, and time. She exhibits normal muscle tone. Coordination normal.  Skin: Skin is warm and dry. No rash noted. No erythema.    ED Course  Procedures (including critical care  time) Labs Review Labs Reviewed  CBC WITH DIFFERENTIAL - Abnormal; Notable for the following:    Neutrophils Relative % 89 (*)    Neutro Abs 8.2 (*)    Lymphocytes Relative 8 (*)    All other components within normal limits  COMPREHENSIVE METABOLIC PANEL - Abnormal; Notable for the following:    Potassium 3.6 (*)    Glucose, Bld 109 (*)    GFR calc non Af Amer 72 (*)    GFR calc Af Amer 83 (*)    All other components within normal limits  URINALYSIS, ROUTINE W REFLEX MICROSCOPIC - Abnormal; Notable for the following:    Specific Gravity, Urine 1.037 (*)    All other components within normal limits  LIPASE, BLOOD   Imaging Review Ct Abdomen Pelvis W Contrast  06/16/2013   CLINICAL DATA:  Vomiting.  Abdominal pain.  EXAM: CT ABDOMEN AND PELVIS WITH CONTRAST  TECHNIQUE: Multidetector CT imaging of the abdomen and pelvis was performed using the standard protocol following bolus administration of intravenous contrast.  CONTRAST:  18mL OMNIPAQUE IOHEXOL 300 MG/ML SOLN, 178mL OMNIPAQUE IOHEXOL 300 MG/ML SOLN  COMPARISON:  CT ABD/PELVIS W CM dated 05/20/2013; CT ABD-PELV W/ CM dated 02/10/2013  FINDINGS: Contrast medium noted in the distal esophagus. Mild intrahepatic biliary dilatation, new from 05/21/2003. Common bile duct 8 mm in diameter, previously about 5 mm. The dorsal pancreatic duct is not dilated. Prior cholecystectomy.  Spleen, adrenal glands, and pancreas unremarkable. Kidneys and proximal ureters unremarkable. Low-grade edema in the root of the mesenteric, increased from prior exam. Scattered small mesenteric lymph nodes. There are several upper normal caliber loops of jejunum with air-fluid levels.  Appendix surgically absent. Scattered sigmoid diverticula noted. Inflammatory findings along the distal sigmoid colon are significantly less striking than on the prior exam, and nearly resolved.  No retroperitoneal adenopathy. Ovarian remnants unremarkable. Uterus absent. Urinary bladder  unremarkable.  Small umbilical hernia contains adipose tissue.  IMPRESSION: 1. Interval development of mild intrahepatic biliary dilatation. The common bile duct measures 8 mm in diameter which is within normal limits given the patient's age and prior cholecystectomy status. Cause for the intrahepatic biliary dilatation is uncertain ; the patient had a sphincterotomy on 12/20/2012. Conceivably this could reflect progressive physiologic response to the patient's prior cholecystectomy from 12/14/2012. 2. Low grade mesenteric edema in the upper abdomen, with several air-fluid levels and borderline prominent loops of jejunum, query proximal enteritis. 3. Contrast medium in the distal esophagus, suggesting gastroesophageal reflux. 4. The inflammatory findings adjacent to the sigmoid colon are significantly improved and nearly resolved compared to 05/20/13.   Electronically Signed   By: Sherryl Barters M.D.   On: 06/16/2013 19:30    Patient received IV fluids and antiemetics.  The patient is feeling better and will be treated  with antiemetics.  At home.  She has tolerated oral fluids here in the emergency Department.  Told to return here for any worsening in her condition and also asked her to follow up with her primary care Dr. for recheck.  Patient most likely has a gastritis from the food that she ate that was old    Brent General, [place] 06/17/13 0015

## 2013-06-19 NOTE — ED Provider Notes (Signed)
Medical screening examination/treatment/procedure(s) were conducted as a shared visit with non-physician practitioner(s) and myself.  I personally evaluated the patient during the encounter.   EKG Interpretation None       Juanjose Mojica, MD 06/19/13 0849 

## 2013-06-20 DIAGNOSIS — K5732 Diverticulitis of large intestine without perforation or abscess without bleeding: Secondary | ICD-10-CM | POA: Diagnosis not present

## 2013-06-20 DIAGNOSIS — R1110 Vomiting, unspecified: Secondary | ICD-10-CM | POA: Diagnosis not present

## 2013-06-26 DIAGNOSIS — R109 Unspecified abdominal pain: Secondary | ICD-10-CM | POA: Diagnosis not present

## 2013-06-26 DIAGNOSIS — K5732 Diverticulitis of large intestine without perforation or abscess without bleeding: Secondary | ICD-10-CM | POA: Diagnosis not present

## 2013-11-05 DIAGNOSIS — R6889 Other general symptoms and signs: Secondary | ICD-10-CM | POA: Diagnosis not present

## 2013-11-05 DIAGNOSIS — J329 Chronic sinusitis, unspecified: Secondary | ICD-10-CM | POA: Diagnosis not present

## 2013-11-05 DIAGNOSIS — R059 Cough, unspecified: Secondary | ICD-10-CM | POA: Diagnosis not present

## 2013-11-05 DIAGNOSIS — R05 Cough: Secondary | ICD-10-CM | POA: Diagnosis not present

## 2013-12-10 DIAGNOSIS — E782 Mixed hyperlipidemia: Secondary | ICD-10-CM | POA: Diagnosis not present

## 2013-12-10 DIAGNOSIS — E039 Hypothyroidism, unspecified: Secondary | ICD-10-CM | POA: Diagnosis not present

## 2013-12-14 DIAGNOSIS — Z23 Encounter for immunization: Secondary | ICD-10-CM | POA: Diagnosis not present

## 2014-03-27 DIAGNOSIS — Z803 Family history of malignant neoplasm of breast: Secondary | ICD-10-CM | POA: Diagnosis not present

## 2014-03-27 DIAGNOSIS — Z1231 Encounter for screening mammogram for malignant neoplasm of breast: Secondary | ICD-10-CM | POA: Diagnosis not present

## 2014-04-25 DIAGNOSIS — N959 Unspecified menopausal and perimenopausal disorder: Secondary | ICD-10-CM | POA: Diagnosis not present

## 2014-04-25 DIAGNOSIS — N760 Acute vaginitis: Secondary | ICD-10-CM | POA: Diagnosis not present

## 2014-06-18 ENCOUNTER — Encounter: Payer: Self-pay | Admitting: Gastroenterology

## 2014-06-26 DIAGNOSIS — R109 Unspecified abdominal pain: Secondary | ICD-10-CM | POA: Diagnosis not present

## 2014-08-08 DIAGNOSIS — Z1211 Encounter for screening for malignant neoplasm of colon: Secondary | ICD-10-CM | POA: Diagnosis not present

## 2014-08-08 DIAGNOSIS — R109 Unspecified abdominal pain: Secondary | ICD-10-CM | POA: Diagnosis not present

## 2014-09-23 ENCOUNTER — Other Ambulatory Visit: Payer: Self-pay | Admitting: Obstetrics & Gynecology

## 2014-09-23 DIAGNOSIS — N907 Vulvar cyst: Secondary | ICD-10-CM | POA: Diagnosis not present

## 2014-09-23 DIAGNOSIS — L720 Epidermal cyst: Secondary | ICD-10-CM | POA: Diagnosis not present

## 2014-09-30 ENCOUNTER — Other Ambulatory Visit: Payer: Self-pay | Admitting: Obstetrics & Gynecology

## 2014-09-30 DIAGNOSIS — N907 Vulvar cyst: Secondary | ICD-10-CM | POA: Diagnosis not present

## 2014-09-30 DIAGNOSIS — N762 Acute vulvitis: Secondary | ICD-10-CM | POA: Diagnosis not present

## 2014-10-07 DIAGNOSIS — N907 Vulvar cyst: Secondary | ICD-10-CM | POA: Diagnosis not present

## 2014-10-28 DIAGNOSIS — E039 Hypothyroidism, unspecified: Secondary | ICD-10-CM | POA: Diagnosis not present

## 2014-12-29 DIAGNOSIS — Z23 Encounter for immunization: Secondary | ICD-10-CM | POA: Diagnosis not present

## 2015-01-01 ENCOUNTER — Encounter: Payer: Self-pay | Admitting: Gastroenterology

## 2015-01-13 DIAGNOSIS — M109 Gout, unspecified: Secondary | ICD-10-CM | POA: Diagnosis not present

## 2015-01-14 DIAGNOSIS — Z23 Encounter for immunization: Secondary | ICD-10-CM | POA: Diagnosis not present

## 2015-04-30 DIAGNOSIS — H5711 Ocular pain, right eye: Secondary | ICD-10-CM | POA: Diagnosis not present

## 2015-05-01 DIAGNOSIS — S0501XA Injury of conjunctiva and corneal abrasion without foreign body, right eye, initial encounter: Secondary | ICD-10-CM | POA: Diagnosis not present

## 2015-05-04 DIAGNOSIS — H5203 Hypermetropia, bilateral: Secondary | ICD-10-CM | POA: Diagnosis not present

## 2015-05-04 DIAGNOSIS — H40033 Anatomical narrow angle, bilateral: Secondary | ICD-10-CM | POA: Diagnosis not present

## 2015-05-04 DIAGNOSIS — H2513 Age-related nuclear cataract, bilateral: Secondary | ICD-10-CM | POA: Diagnosis not present

## 2015-05-04 DIAGNOSIS — H524 Presbyopia: Secondary | ICD-10-CM | POA: Diagnosis not present

## 2015-05-04 DIAGNOSIS — H52221 Regular astigmatism, right eye: Secondary | ICD-10-CM | POA: Diagnosis not present

## 2015-05-05 DIAGNOSIS — H40033 Anatomical narrow angle, bilateral: Secondary | ICD-10-CM | POA: Diagnosis not present

## 2015-05-06 DIAGNOSIS — H40033 Anatomical narrow angle, bilateral: Secondary | ICD-10-CM | POA: Diagnosis not present

## 2015-05-08 DIAGNOSIS — H40033 Anatomical narrow angle, bilateral: Secondary | ICD-10-CM | POA: Diagnosis not present

## 2015-05-28 DIAGNOSIS — Z1231 Encounter for screening mammogram for malignant neoplasm of breast: Secondary | ICD-10-CM | POA: Diagnosis not present

## 2015-05-28 DIAGNOSIS — E039 Hypothyroidism, unspecified: Secondary | ICD-10-CM | POA: Diagnosis not present

## 2015-05-28 DIAGNOSIS — Z1289 Encounter for screening for malignant neoplasm of other sites: Secondary | ICD-10-CM | POA: Diagnosis not present

## 2015-06-23 DIAGNOSIS — Z1211 Encounter for screening for malignant neoplasm of colon: Secondary | ICD-10-CM | POA: Diagnosis not present

## 2015-06-23 DIAGNOSIS — K5730 Diverticulosis of large intestine without perforation or abscess without bleeding: Secondary | ICD-10-CM | POA: Diagnosis not present

## 2015-07-16 DIAGNOSIS — H40033 Anatomical narrow angle, bilateral: Secondary | ICD-10-CM | POA: Diagnosis not present

## 2015-12-14 DIAGNOSIS — Z23 Encounter for immunization: Secondary | ICD-10-CM | POA: Diagnosis not present

## 2015-12-30 DIAGNOSIS — D239 Other benign neoplasm of skin, unspecified: Secondary | ICD-10-CM | POA: Diagnosis not present

## 2015-12-30 DIAGNOSIS — D1800 Hemangioma unspecified site: Secondary | ICD-10-CM | POA: Diagnosis not present

## 2015-12-30 DIAGNOSIS — L821 Other seborrheic keratosis: Secondary | ICD-10-CM | POA: Diagnosis not present

## 2016-01-16 DIAGNOSIS — Z23 Encounter for immunization: Secondary | ICD-10-CM | POA: Diagnosis not present

## 2016-01-16 DIAGNOSIS — J309 Allergic rhinitis, unspecified: Secondary | ICD-10-CM | POA: Diagnosis not present

## 2016-02-23 DIAGNOSIS — R399 Unspecified symptoms and signs involving the genitourinary system: Secondary | ICD-10-CM | POA: Diagnosis not present

## 2016-03-22 ENCOUNTER — Other Ambulatory Visit: Payer: Self-pay | Admitting: Surgery

## 2016-03-22 DIAGNOSIS — L723 Sebaceous cyst: Secondary | ICD-10-CM | POA: Diagnosis not present

## 2016-03-24 DIAGNOSIS — S0502XA Injury of conjunctiva and corneal abrasion without foreign body, left eye, initial encounter: Secondary | ICD-10-CM | POA: Diagnosis not present

## 2016-03-25 DIAGNOSIS — S0502XD Injury of conjunctiva and corneal abrasion without foreign body, left eye, subsequent encounter: Secondary | ICD-10-CM | POA: Diagnosis not present

## 2016-04-18 ENCOUNTER — Other Ambulatory Visit: Payer: Self-pay | Admitting: Surgery

## 2016-04-18 DIAGNOSIS — L720 Epidermal cyst: Secondary | ICD-10-CM | POA: Diagnosis not present

## 2016-04-18 DIAGNOSIS — L723 Sebaceous cyst: Secondary | ICD-10-CM | POA: Diagnosis not present

## 2016-06-11 DIAGNOSIS — J111 Influenza due to unidentified influenza virus with other respiratory manifestations: Secondary | ICD-10-CM | POA: Diagnosis not present

## 2016-06-11 DIAGNOSIS — R05 Cough: Secondary | ICD-10-CM | POA: Diagnosis not present

## 2016-07-29 DIAGNOSIS — E039 Hypothyroidism, unspecified: Secondary | ICD-10-CM | POA: Diagnosis not present

## 2016-07-29 DIAGNOSIS — Z79899 Other long term (current) drug therapy: Secondary | ICD-10-CM | POA: Diagnosis not present

## 2016-07-29 DIAGNOSIS — M1990 Unspecified osteoarthritis, unspecified site: Secondary | ICD-10-CM | POA: Diagnosis not present

## 2016-08-01 DIAGNOSIS — Z803 Family history of malignant neoplasm of breast: Secondary | ICD-10-CM | POA: Diagnosis not present

## 2016-08-01 DIAGNOSIS — Z1231 Encounter for screening mammogram for malignant neoplasm of breast: Secondary | ICD-10-CM | POA: Diagnosis not present

## 2016-11-17 DIAGNOSIS — H6980 Other specified disorders of Eustachian tube, unspecified ear: Secondary | ICD-10-CM | POA: Diagnosis not present

## 2016-11-17 DIAGNOSIS — M542 Cervicalgia: Secondary | ICD-10-CM | POA: Diagnosis not present

## 2016-12-20 DIAGNOSIS — Z23 Encounter for immunization: Secondary | ICD-10-CM | POA: Diagnosis not present

## 2017-04-18 DIAGNOSIS — J029 Acute pharyngitis, unspecified: Secondary | ICD-10-CM | POA: Diagnosis not present

## 2017-04-18 DIAGNOSIS — J0100 Acute maxillary sinusitis, unspecified: Secondary | ICD-10-CM | POA: Diagnosis not present

## 2017-05-13 DIAGNOSIS — L509 Urticaria, unspecified: Secondary | ICD-10-CM | POA: Diagnosis not present

## 2017-10-24 DIAGNOSIS — Z636 Dependent relative needing care at home: Secondary | ICD-10-CM | POA: Diagnosis not present

## 2017-10-24 DIAGNOSIS — Z23 Encounter for immunization: Secondary | ICD-10-CM | POA: Diagnosis not present

## 2017-10-24 DIAGNOSIS — E039 Hypothyroidism, unspecified: Secondary | ICD-10-CM | POA: Diagnosis not present

## 2017-10-24 DIAGNOSIS — Z79899 Other long term (current) drug therapy: Secondary | ICD-10-CM | POA: Diagnosis not present

## 2017-10-24 DIAGNOSIS — E7800 Pure hypercholesterolemia, unspecified: Secondary | ICD-10-CM | POA: Diagnosis not present

## 2017-10-26 DIAGNOSIS — Z780 Asymptomatic menopausal state: Secondary | ICD-10-CM | POA: Diagnosis not present

## 2017-10-26 DIAGNOSIS — Z803 Family history of malignant neoplasm of breast: Secondary | ICD-10-CM | POA: Diagnosis not present

## 2017-10-26 DIAGNOSIS — M8589 Other specified disorders of bone density and structure, multiple sites: Secondary | ICD-10-CM | POA: Diagnosis not present

## 2017-10-26 DIAGNOSIS — Z1231 Encounter for screening mammogram for malignant neoplasm of breast: Secondary | ICD-10-CM | POA: Diagnosis not present

## 2017-12-13 DIAGNOSIS — Z23 Encounter for immunization: Secondary | ICD-10-CM | POA: Diagnosis not present

## 2017-12-15 DIAGNOSIS — F430 Acute stress reaction: Secondary | ICD-10-CM | POA: Diagnosis not present

## 2017-12-21 ENCOUNTER — Other Ambulatory Visit: Payer: Self-pay | Admitting: Family Medicine

## 2017-12-21 ENCOUNTER — Ambulatory Visit
Admission: RE | Admit: 2017-12-21 | Discharge: 2017-12-21 | Disposition: A | Discharge status: Home / Self Care | Payer: Medicare Other | Source: Ambulatory Visit | Attending: Family Medicine | Admitting: Family Medicine

## 2017-12-21 DIAGNOSIS — R350 Frequency of micturition: Secondary | ICD-10-CM | POA: Diagnosis not present

## 2017-12-21 DIAGNOSIS — R1030 Lower abdominal pain, unspecified: Secondary | ICD-10-CM

## 2017-12-21 MED ORDER — IOPAMIDOL (ISOVUE-300) INJECTION 61%
100.0000 mL | Freq: Once | INTRAVENOUS | Status: AC | PRN
  Administered 2017-12-21: 100 mL via INTRAVENOUS

## 2018-05-02 DIAGNOSIS — H2513 Age-related nuclear cataract, bilateral: Secondary | ICD-10-CM | POA: Diagnosis not present

## 2018-05-02 DIAGNOSIS — H5203 Hypermetropia, bilateral: Secondary | ICD-10-CM | POA: Diagnosis not present

## 2018-05-02 DIAGNOSIS — H52223 Regular astigmatism, bilateral: Secondary | ICD-10-CM | POA: Diagnosis not present

## 2018-05-02 DIAGNOSIS — H524 Presbyopia: Secondary | ICD-10-CM | POA: Diagnosis not present

## 2018-06-02 DIAGNOSIS — R42 Dizziness and giddiness: Secondary | ICD-10-CM | POA: Diagnosis not present

## 2018-09-27 DIAGNOSIS — L03011 Cellulitis of right finger: Secondary | ICD-10-CM | POA: Diagnosis not present

## 2018-10-04 DIAGNOSIS — L03011 Cellulitis of right finger: Secondary | ICD-10-CM | POA: Diagnosis not present

## 2018-11-29 DIAGNOSIS — Z23 Encounter for immunization: Secondary | ICD-10-CM | POA: Diagnosis not present

## 2018-12-12 DIAGNOSIS — Z803 Family history of malignant neoplasm of breast: Secondary | ICD-10-CM | POA: Diagnosis not present

## 2018-12-12 DIAGNOSIS — Z1231 Encounter for screening mammogram for malignant neoplasm of breast: Secondary | ICD-10-CM | POA: Diagnosis not present

## 2018-12-23 DIAGNOSIS — L03011 Cellulitis of right finger: Secondary | ICD-10-CM | POA: Diagnosis not present

## 2019-01-31 DIAGNOSIS — Z79899 Other long term (current) drug therapy: Secondary | ICD-10-CM | POA: Diagnosis not present

## 2019-01-31 DIAGNOSIS — Z636 Dependent relative needing care at home: Secondary | ICD-10-CM | POA: Diagnosis not present

## 2019-01-31 DIAGNOSIS — E039 Hypothyroidism, unspecified: Secondary | ICD-10-CM | POA: Diagnosis not present

## 2019-02-11 DIAGNOSIS — Z03818 Encounter for observation for suspected exposure to other biological agents ruled out: Secondary | ICD-10-CM | POA: Diagnosis not present

## 2019-02-28 DIAGNOSIS — M545 Low back pain: Secondary | ICD-10-CM | POA: Diagnosis not present

## 2019-02-28 DIAGNOSIS — M5441 Lumbago with sciatica, right side: Secondary | ICD-10-CM | POA: Diagnosis not present

## 2019-03-10 DIAGNOSIS — Z03818 Encounter for observation for suspected exposure to other biological agents ruled out: Secondary | ICD-10-CM | POA: Diagnosis not present

## 2019-03-13 DIAGNOSIS — M5416 Radiculopathy, lumbar region: Secondary | ICD-10-CM | POA: Diagnosis not present

## 2019-03-13 DIAGNOSIS — S76311D Strain of muscle, fascia and tendon of the posterior muscle group at thigh level, right thigh, subsequent encounter: Secondary | ICD-10-CM | POA: Diagnosis not present

## 2019-03-19 DIAGNOSIS — M5441 Lumbago with sciatica, right side: Secondary | ICD-10-CM | POA: Diagnosis not present

## 2019-04-24 DIAGNOSIS — Z03818 Encounter for observation for suspected exposure to other biological agents ruled out: Secondary | ICD-10-CM | POA: Diagnosis not present

## 2019-04-29 DIAGNOSIS — H2513 Age-related nuclear cataract, bilateral: Secondary | ICD-10-CM | POA: Diagnosis not present

## 2019-04-29 DIAGNOSIS — H5203 Hypermetropia, bilateral: Secondary | ICD-10-CM | POA: Diagnosis not present

## 2019-04-29 DIAGNOSIS — H52223 Regular astigmatism, bilateral: Secondary | ICD-10-CM | POA: Diagnosis not present

## 2019-04-29 DIAGNOSIS — H524 Presbyopia: Secondary | ICD-10-CM | POA: Diagnosis not present

## 2019-05-06 DIAGNOSIS — Z03818 Encounter for observation for suspected exposure to other biological agents ruled out: Secondary | ICD-10-CM | POA: Diagnosis not present

## 2019-05-08 DIAGNOSIS — Z03818 Encounter for observation for suspected exposure to other biological agents ruled out: Secondary | ICD-10-CM | POA: Diagnosis not present

## 2019-05-13 DIAGNOSIS — Z03818 Encounter for observation for suspected exposure to other biological agents ruled out: Secondary | ICD-10-CM | POA: Diagnosis not present

## 2019-05-15 DIAGNOSIS — Z03818 Encounter for observation for suspected exposure to other biological agents ruled out: Secondary | ICD-10-CM | POA: Diagnosis not present

## 2019-05-21 DIAGNOSIS — Z03818 Encounter for observation for suspected exposure to other biological agents ruled out: Secondary | ICD-10-CM | POA: Diagnosis not present

## 2019-05-25 DIAGNOSIS — Z03818 Encounter for observation for suspected exposure to other biological agents ruled out: Secondary | ICD-10-CM | POA: Diagnosis not present

## 2019-05-29 DIAGNOSIS — Z03818 Encounter for observation for suspected exposure to other biological agents ruled out: Secondary | ICD-10-CM | POA: Diagnosis not present

## 2019-06-03 DIAGNOSIS — Z03818 Encounter for observation for suspected exposure to other biological agents ruled out: Secondary | ICD-10-CM | POA: Diagnosis not present

## 2019-10-18 ENCOUNTER — Other Ambulatory Visit: Payer: Self-pay | Admitting: Surgery

## 2019-11-20 DIAGNOSIS — E039 Hypothyroidism, unspecified: Secondary | ICD-10-CM | POA: Diagnosis not present

## 2019-11-20 DIAGNOSIS — S80869A Insect bite (nonvenomous), unspecified lower leg, initial encounter: Secondary | ICD-10-CM | POA: Diagnosis not present

## 2019-11-20 DIAGNOSIS — S40869A Insect bite (nonvenomous) of unspecified upper arm, initial encounter: Secondary | ICD-10-CM | POA: Diagnosis not present

## 2019-11-20 DIAGNOSIS — F5104 Psychophysiologic insomnia: Secondary | ICD-10-CM | POA: Diagnosis not present

## 2019-11-20 DIAGNOSIS — R202 Paresthesia of skin: Secondary | ICD-10-CM | POA: Diagnosis not present

## 2019-11-20 DIAGNOSIS — R252 Cramp and spasm: Secondary | ICD-10-CM | POA: Diagnosis not present

## 2019-11-20 DIAGNOSIS — W57XXXA Bitten or stung by nonvenomous insect and other nonvenomous arthropods, initial encounter: Secondary | ICD-10-CM | POA: Diagnosis not present

## 2019-12-18 DIAGNOSIS — Z1231 Encounter for screening mammogram for malignant neoplasm of breast: Secondary | ICD-10-CM | POA: Diagnosis not present

## 2019-12-19 DIAGNOSIS — Z23 Encounter for immunization: Secondary | ICD-10-CM | POA: Diagnosis not present

## 2020-01-06 DIAGNOSIS — Z23 Encounter for immunization: Secondary | ICD-10-CM | POA: Diagnosis not present

## 2020-04-07 ENCOUNTER — Telehealth: Payer: Self-pay | Admitting: Nurse Practitioner

## 2020-04-07 DIAGNOSIS — U071 COVID-19: Secondary | ICD-10-CM

## 2020-04-07 NOTE — Telephone Encounter (Signed)
Called to discuss with Benjamin Stain about Covid symptoms and the use of a monoclonal antibody infusion for those with mild to moderate Covid symptoms and at a high risk of hospitalization.     Pt is qualified for this infusion at the [facility] center due to co-morbid conditions and/or a member of an at-risk group, however due to drug shortage, criteria have been narrowed and patient does not meet those criteria. Additionally, patient is fully vaccinated and received booster. She felt poorly yesterday but feels nearly back to baseline today and does not feel she needs mAB.  Symptoms tier reviewed as well as criteria for ending isolation.  Symptoms reviewed that would warrant ED/Hospital evaluation. Preventative practices reviewed. Patient verbalized understanding. Patient advised to call back if he/she opts to proceed with infusion. Callback number provided. Urgent care and/or ER precautions given for severe symptoms.    Patient Active Problem List   Diagnosis Date Noted  . UTI (urinary tract infection) 02/11/2013  . Fever 02/10/2013  . Nausea with vomiting 02/10/2013  . Choledocholithiasis 12/15/2012  . Dilated bile duct 12/13/2012  . Hx of gastroesophageal reflux (GERD) 12/12/2012  . Cholelithiasis with obstruction 12/12/2012  . Hypothyroid 12/12/2012  . Hx of diverticulitis of colon 12/12/2012  . Symptomatic cholelithiasis 12/11/2012  . Infected sebaceous cyst of skin 04/17/2012   Consuello Masse, NP [phone] [email or web address]

## 2020-06-01 DIAGNOSIS — H40009 Preglaucoma, unspecified, unspecified eye: Secondary | ICD-10-CM | POA: Diagnosis not present

## 2020-06-01 DIAGNOSIS — H52223 Regular astigmatism, bilateral: Secondary | ICD-10-CM | POA: Diagnosis not present

## 2020-06-01 DIAGNOSIS — H40013 Open angle with borderline findings, low risk, bilateral: Secondary | ICD-10-CM | POA: Diagnosis not present

## 2020-06-01 DIAGNOSIS — H2513 Age-related nuclear cataract, bilateral: Secondary | ICD-10-CM | POA: Diagnosis not present

## 2020-06-01 DIAGNOSIS — H5203 Hypermetropia, bilateral: Secondary | ICD-10-CM | POA: Diagnosis not present

## 2020-06-01 DIAGNOSIS — H524 Presbyopia: Secondary | ICD-10-CM | POA: Diagnosis not present

## 2020-06-29 DIAGNOSIS — E039 Hypothyroidism, unspecified: Secondary | ICD-10-CM | POA: Diagnosis not present

## 2020-09-30 DIAGNOSIS — E039 Hypothyroidism, unspecified: Secondary | ICD-10-CM | POA: Diagnosis not present

## 2020-09-30 DIAGNOSIS — F411 Generalized anxiety disorder: Secondary | ICD-10-CM | POA: Diagnosis not present

## 2020-09-30 DIAGNOSIS — F5104 Psychophysiologic insomnia: Secondary | ICD-10-CM | POA: Diagnosis not present

## 2020-12-17 DIAGNOSIS — E7800 Pure hypercholesterolemia, unspecified: Secondary | ICD-10-CM | POA: Diagnosis not present

## 2020-12-17 DIAGNOSIS — E039 Hypothyroidism, unspecified: Secondary | ICD-10-CM | POA: Diagnosis not present

## 2020-12-17 DIAGNOSIS — Z79899 Other long term (current) drug therapy: Secondary | ICD-10-CM | POA: Diagnosis not present

## 2020-12-17 DIAGNOSIS — G609 Hereditary and idiopathic neuropathy, unspecified: Secondary | ICD-10-CM | POA: Diagnosis not present

## 2020-12-21 DIAGNOSIS — Z1231 Encounter for screening mammogram for malignant neoplasm of breast: Secondary | ICD-10-CM | POA: Diagnosis not present

## 2020-12-21 DIAGNOSIS — Z23 Encounter for immunization: Secondary | ICD-10-CM | POA: Diagnosis not present

## 2020-12-29 DIAGNOSIS — Z23 Encounter for immunization: Secondary | ICD-10-CM | POA: Diagnosis not present

## 2021-01-11 DIAGNOSIS — L814 Other melanin hyperpigmentation: Secondary | ICD-10-CM | POA: Diagnosis not present

## 2021-01-18 DIAGNOSIS — M85851 Other specified disorders of bone density and structure, right thigh: Secondary | ICD-10-CM | POA: Diagnosis not present

## 2021-02-05 DIAGNOSIS — M25512 Pain in left shoulder: Secondary | ICD-10-CM | POA: Diagnosis not present

## 2021-02-05 DIAGNOSIS — M4722 Other spondylosis with radiculopathy, cervical region: Secondary | ICD-10-CM | POA: Diagnosis not present

## 2021-02-05 DIAGNOSIS — M5416 Radiculopathy, lumbar region: Secondary | ICD-10-CM | POA: Diagnosis not present

## 2021-02-12 DIAGNOSIS — M5412 Radiculopathy, cervical region: Secondary | ICD-10-CM | POA: Diagnosis not present

## 2021-02-17 DIAGNOSIS — M5412 Radiculopathy, cervical region: Secondary | ICD-10-CM | POA: Diagnosis not present

## 2021-02-19 DIAGNOSIS — M5412 Radiculopathy, cervical region: Secondary | ICD-10-CM | POA: Diagnosis not present

## 2021-02-23 ENCOUNTER — Other Ambulatory Visit: Payer: Self-pay | Admitting: Orthopedic Surgery

## 2021-02-23 DIAGNOSIS — M542 Cervicalgia: Secondary | ICD-10-CM

## 2021-02-23 DIAGNOSIS — M5412 Radiculopathy, cervical region: Secondary | ICD-10-CM | POA: Diagnosis not present

## 2021-02-24 DIAGNOSIS — M5412 Radiculopathy, cervical region: Secondary | ICD-10-CM | POA: Diagnosis not present

## 2021-02-24 DIAGNOSIS — E7800 Pure hypercholesterolemia, unspecified: Secondary | ICD-10-CM | POA: Diagnosis not present

## 2021-02-24 DIAGNOSIS — M4722 Other spondylosis with radiculopathy, cervical region: Secondary | ICD-10-CM | POA: Diagnosis not present

## 2021-02-25 DIAGNOSIS — M5412 Radiculopathy, cervical region: Secondary | ICD-10-CM | POA: Diagnosis not present

## 2021-03-01 DIAGNOSIS — M5412 Radiculopathy, cervical region: Secondary | ICD-10-CM | POA: Diagnosis not present

## 2021-03-03 DIAGNOSIS — M5412 Radiculopathy, cervical region: Secondary | ICD-10-CM | POA: Diagnosis not present

## 2021-03-06 ENCOUNTER — Other Ambulatory Visit: Payer: Self-pay

## 2021-03-06 ENCOUNTER — Ambulatory Visit
Admission: RE | Admit: 2021-03-06 | Discharge: 2021-03-06 | Disposition: A | Discharge status: Home / Self Care | Payer: PRIVATE HEALTH INSURANCE | Source: Ambulatory Visit | Attending: Orthopedic Surgery | Admitting: Orthopedic Surgery

## 2021-03-06 DIAGNOSIS — M4802 Spinal stenosis, cervical region: Secondary | ICD-10-CM | POA: Diagnosis not present

## 2021-03-06 DIAGNOSIS — M542 Cervicalgia: Secondary | ICD-10-CM

## 2021-03-08 ENCOUNTER — Encounter: Payer: Self-pay | Admitting: Neurology

## 2021-03-08 ENCOUNTER — Ambulatory Visit (INDEPENDENT_AMBULATORY_CARE_PROVIDER_SITE_OTHER): Payer: Medicare Other | Admitting: Neurology

## 2021-03-08 VITALS — BP 162/76 | HR 85 | Ht 62.0 in | Wt 152.5 lb

## 2021-03-08 DIAGNOSIS — R202 Paresthesia of skin: Secondary | ICD-10-CM

## 2021-03-08 DIAGNOSIS — R252 Cramp and spasm: Secondary | ICD-10-CM | POA: Diagnosis not present

## 2021-03-08 NOTE — Progress Notes (Signed)
Chief Complaint  Patient presents with   New Patient (Initial Visit)    Rm 14. Alone. NP Dr. Jonathon Jordan MD. Pt c/o of tingling in feet (primarily in left foot) bilateral lower extremities. Pt also c/o leg cramps.      ASSESSMENT AND PLAN  Lori Webb is a 80 y.o. female   Intermittent lower extremity paresthesia, frequent calf muscle cramping,  History of osteoarthritis, intermittent low back pain,  Differentiation diagnosis include lumbar radiculopathy, versus peripheral neuropathy, electrolyte imbalance,  Discussed with patient about further evaluation, patient stated that she is functioning well, rarely has muscle cramps now, does not want to proceed with further testing,    DIAGNOSTIC DATA (LABS, IMAGING, TESTING) - I reviewed patient records, labs, notes, testing and imaging myself where available. Laboratory evaluations in September 2022, normal C-reactive protein, cholesterol 219, normal TSH, ESR of 6, CMP creatinine of 0.9, CBC hemoglobin of 15.1, B12 of 482  MRI cervical in Nov 2022 1. Multilevel cervical spondylosis with resultant mild spinal stenosis at C4-5 through C6-7. 2. Multifactorial degenerative changes with resultant mild to moderate bilateral C5 through C7 foraminal stenosis as detailed above MEDICAL HISTORY:  Lori Webb is a 80 year old female, seen in request by her primary care physician Dr. Stephanie Acre, I[place] Booty, for evaluation of bilateral lower extremity paresthesia, frequent leg muscle cramping, initial evaluation was on November 28, to 20.  I reviewed and summarized the referring note.  Past medical history Hypothyroidism, on supplement,  Patient still work at [employer], also drives to visit her husband almost daily basis, very active, without any difficulty, but complains of difficulty sleeping, tired,  In October 2022, she had a sudden onset of neck pain, radiating pain to left upper extremity, lasting for 2 weeks, I personally  reviewed MRI of cervical spine, multilevel degenerative changes, no significant canal stenosis, mild to moderate bilateral C5-6 foraminal narrowing, left cervical radicular pain was eventually with the treatment at her primary care's office, I assume it was a Toradol injection  She did have a history of intermittent low back pain, over the summer 2022, she was wearing thin soled sandals, also walk long distance, mildly dehydrated, during that period of time, she experienced frequent neck muscle cramping, more to left side, sometimes woke her up in the middle of the sleep, significant pain, she has to get up pacing around, at its peak it happened couple times each week  Now she worries more cushioned shoes, keep better hydration, her symptoms has much improved, she denies significant pain, denies bowel and bladder incontinence, mild intermittent bilateral plantar surface especially ball of her feet paresthesia,   PHYSICAL EXAM:   Vitals:   03/08/21 1356  BP: (!) 162/76  Pulse: 85  Weight: 152 lb 8 oz (69.2 kg)  Height: _0  (1.575 m)   Not recorded     Body mass index is 27.89 kg/m.  PHYSICAL EXAMNIATION:  Gen: NAD, conversant, well nourised, well groomed                     Cardiovascular: Regular rate rhythm, no peripheral edema, warm, nontender. Eyes: Conjunctivae clear without exudates or hemorrhage Neck: Supple, no carotid bruits. Pulmonary: Clear to auscultation bilaterally   NEUROLOGICAL EXAM:  MENTAL STATUS: Speech:    Speech is normal; fluent and spontaneous with normal comprehension.  Cognition:     Orientation to time, place and person     Normal recent and remote memory  Normal Attention span and concentration     Normal Language, naming, repeating,spontaneous speech     Fund of knowledge   CRANIAL NERVES: CN II: Visual fields are full to confrontation. Pupils are round equal and briskly reactive to light. CN III, IV, VI: extraocular movement are normal. No  ptosis. CN V: Facial sensation is intact to light touch CN VII: Face is symmetric with normal eye closure  CN VIII: Hearing is normal to causal conversation. CN IX, X: Phonation is normal. CN XI: Head turning and shoulder shrug are intact  MOTOR: There is no pronator drift of out-stretched arms. Muscle bulk and tone are normal. Muscle strength is normal.  REFLEXES: Reflexes are 2+ and symmetric at the biceps, triceps, knees, and ankles. Plantar responses are flexor.  SENSORY: Intact to light touch, pinprick and vibratory sensation are intact in fingers and toes.  COORDINATION: There is no trunk or limb dysmetria noted.  GAIT/STANCE: Able to get up from seated position arm crossed, gait is steady with normal steps, base, arm swing, and turning. Heel and toe walking are normal. Tandem gait is normal.  Romberg is absent.  REVIEW OF SYSTEMS:  Full 14 system review of systems performed and notable only for as above All other review of systems were negative.   ALLERGIES: Allergies  Allergen Reactions   Crestor [Rosuvastatin]     HOME MEDICATIONS: Current Outpatient Medications  Medication Sig Dispense Refill   levothyroxine (SYNTHROID, LEVOTHROID) 100 MCG tablet Take 100 mcg by mouth daily before breakfast.      No current facility-administered medications for this visit.    PAST MEDICAL HISTORY: Past Medical History:  Diagnosis Date   Anxiety    Cholelithiasis with obstruction 12/12/2012   Diverticulosis    Hx of diverticulitis of colon 12/12/2012   11/21/12 first diagnosis of this.   Hypothyroid 12/12/2012   S/p thyroidectomy for Goiter.    PAST SURGICAL HISTORY: Past Surgical History:  Procedure Laterality Date   ABDOMINAL HYSTERECTOMY     APPENDECTOMY     CESAREAN SECTION     x2   CHOLECYSTECTOMY N/A 12/14/2012   Procedure: LAPAROSCOPIC CHOLECYSTECTOMY WITH INTRAOPERATIVE CHOLANGIOGRAM;  Surgeon: Odis Hollingshead, MD;  Location: [facility];  Service: General;   Laterality: N/A;   COLONOSCOPY     DILATION AND CURETTAGE OF UTERUS     ERCP N/A 12/13/2012   Procedure: ENDOSCOPIC RETROGRADE CHOLANGIOPANCREATOGRAPHY (ERCP);  Surgeon: Gatha Mayer, MD;  Location: [facility];  Service: Endoscopy;  Laterality: N/A;   ERCP N/A 12/16/2012   Procedure: ENDOSCOPIC RETROGRADE CHOLANGIOPANCREATOGRAPHY (ERCP);  Surgeon: Gatha Mayer, MD;  Location: [facility];  Service: Gastroenterology;  Laterality: N/A;   MASS EXCISION  05/02/2012   Procedure: EXCISION MASS;  Surgeon: Harl Bowie, MD;  Location: [facility];  Service: General;  Laterality: Right;  excision cyst right upper back   PLANTAR FASCIA RELEASE  2009   left heel   THYROIDECTOMY     TONSILLECTOMY      FAMILY HISTORY: Family History  Problem Relation Age of Onset   Cancer Mother        breast   Cancer Maternal Grandmother        breast    SOCIAL HISTORY: Social History   Socioeconomic History   Marital status: Married    Spouse name: Not on file   Number of children: Not on file   Years of education: Not on file   Highest education level: Not on file  Occupational History   Not on file  Tobacco Use   Smoking status: Never   Smokeless tobacco: Never  Substance and Sexual Activity   Alcohol use: Yes    Alcohol/week: 1.0 standard drink    Types: 1 Glasses of wine per week    Comment: 1 glass of wine per night   Drug use: No   Sexual activity: Never  Other Topics Concern   Not on file  Social History Narrative   Not on file   Social Determinants of Health   Financial Resource Strain: Not on file  Food Insecurity: Not on file  Transportation Needs: Not on file  Physical Activity: Not on file  Stress: Not on file  Social Connections: Not on file  Intimate Partner Violence: Not on file      Marcial Pacas, M.D. Ph.D.  [facility] [address] [place], [place] [address] Ph: [phone] Fax: [phone]  CC:  Jonathon Jordan,  MD [place],  [place] [address]  Jonathon Jordan, MD

## 2021-03-10 DIAGNOSIS — M542 Cervicalgia: Secondary | ICD-10-CM | POA: Diagnosis not present

## 2021-03-10 DIAGNOSIS — M5412 Radiculopathy, cervical region: Secondary | ICD-10-CM | POA: Diagnosis not present

## 2021-03-10 DIAGNOSIS — M545 Low back pain, unspecified: Secondary | ICD-10-CM | POA: Diagnosis not present

## 2021-03-11 DIAGNOSIS — M533 Sacrococcygeal disorders, not elsewhere classified: Secondary | ICD-10-CM | POA: Diagnosis not present

## 2021-03-15 DIAGNOSIS — M533 Sacrococcygeal disorders, not elsewhere classified: Secondary | ICD-10-CM | POA: Diagnosis not present

## 2021-03-16 DIAGNOSIS — M533 Sacrococcygeal disorders, not elsewhere classified: Secondary | ICD-10-CM | POA: Diagnosis not present

## 2021-03-19 DIAGNOSIS — M533 Sacrococcygeal disorders, not elsewhere classified: Secondary | ICD-10-CM | POA: Diagnosis not present

## 2021-03-23 DIAGNOSIS — H52223 Regular astigmatism, bilateral: Secondary | ICD-10-CM | POA: Diagnosis not present

## 2021-03-23 DIAGNOSIS — M5416 Radiculopathy, lumbar region: Secondary | ICD-10-CM | POA: Diagnosis not present

## 2021-03-23 DIAGNOSIS — H00012 Hordeolum externum right lower eyelid: Secondary | ICD-10-CM | POA: Diagnosis not present

## 2021-03-23 DIAGNOSIS — M533 Sacrococcygeal disorders, not elsewhere classified: Secondary | ICD-10-CM | POA: Diagnosis not present

## 2021-03-23 DIAGNOSIS — H5203 Hypermetropia, bilateral: Secondary | ICD-10-CM | POA: Diagnosis not present

## 2021-03-23 DIAGNOSIS — H524 Presbyopia: Secondary | ICD-10-CM | POA: Diagnosis not present

## 2021-03-25 DIAGNOSIS — M533 Sacrococcygeal disorders, not elsewhere classified: Secondary | ICD-10-CM | POA: Diagnosis not present

## 2021-03-26 DIAGNOSIS — M5126 Other intervertebral disc displacement, lumbar region: Secondary | ICD-10-CM | POA: Diagnosis not present

## 2021-03-26 DIAGNOSIS — M48061 Spinal stenosis, lumbar region without neurogenic claudication: Secondary | ICD-10-CM | POA: Diagnosis not present

## 2021-03-26 DIAGNOSIS — M47816 Spondylosis without myelopathy or radiculopathy, lumbar region: Secondary | ICD-10-CM | POA: Diagnosis not present

## 2021-03-31 DIAGNOSIS — M533 Sacrococcygeal disorders, not elsewhere classified: Secondary | ICD-10-CM | POA: Diagnosis not present

## 2021-04-01 DIAGNOSIS — M533 Sacrococcygeal disorders, not elsewhere classified: Secondary | ICD-10-CM | POA: Diagnosis not present

## 2021-04-07 DIAGNOSIS — M6799 Unspecified disorder of synovium and tendon, multiple sites: Secondary | ICD-10-CM | POA: Diagnosis not present

## 2021-04-07 DIAGNOSIS — M1612 Unilateral primary osteoarthritis, left hip: Secondary | ICD-10-CM | POA: Diagnosis not present

## 2021-04-07 DIAGNOSIS — S73192A Other sprain of left hip, initial encounter: Secondary | ICD-10-CM | POA: Diagnosis not present

## 2021-04-13 DIAGNOSIS — M5416 Radiculopathy, lumbar region: Secondary | ICD-10-CM | POA: Diagnosis not present

## 2021-04-21 DIAGNOSIS — M5416 Radiculopathy, lumbar region: Secondary | ICD-10-CM | POA: Diagnosis not present

## 2021-04-29 ENCOUNTER — Telehealth: Payer: Self-pay | Admitting: Neurology

## 2021-04-29 NOTE — Telephone Encounter (Signed)
I spoke to the patient. The appt has been moved to 05/13/21. Reports having lumbar and hip MRI in December 2022 at [facility]. Printed results in [place]. She will also bring the disc to her follow up.

## 2021-04-29 NOTE — Telephone Encounter (Signed)
Pt has scheduled a f/u due to worsening leg cramp possible neuropathy.  Pt is on wait list but asked that this message be sent to hear from Dr Krista Blue re: any suggestions while waiting to be seen.

## 2021-05-13 ENCOUNTER — Encounter: Payer: Self-pay | Admitting: Neurology

## 2021-05-13 ENCOUNTER — Ambulatory Visit: Payer: Medicare Other | Admitting: Neurology

## 2021-05-13 VITALS — BP 118/72 | HR 78 | Ht 62.0 in | Wt 152.5 lb

## 2021-05-13 DIAGNOSIS — M5416 Radiculopathy, lumbar region: Secondary | ICD-10-CM

## 2021-05-13 MED ORDER — DULOXETINE HCL 60 MG PO CPEP: 60.0000 mg | ORAL_CAPSULE | Freq: Every day | ORAL | 11 refills | Status: DC

## 2021-05-13 MED ORDER — DULOXETINE HCL 30 MG PO CPEP: 30.0000 mg | ORAL_CAPSULE | Freq: Every day | ORAL | 0 refills | Status: DC

## 2021-05-13 MED ORDER — GABAPENTIN 100 MG PO CAPS: 300.0000 mg | ORAL_CAPSULE | Freq: Every evening | ORAL | 11 refills | Status: DC | PRN

## 2021-05-13 NOTE — Patient Instructions (Signed)
Meds ordered this encounter  Medications   DULoxetine (CYMBALTA) 30 MG capsule    Sig: Take 1 capsule (30 mg total) by mouth daily.    Dispense:  30 capsule    Refill:  0    Fill her Cymbalta 30mg  daily first.   DULoxetine (CYMBALTA) 60 MG capsule -after you can tolerate 30mg  daily    Sig: Take 1 capsule (60 mg total) by mouth daily.  .    Dispense:  30 capsule    Refill:  11   gabapentin (NEURONTIN) 100 MG capsule    Sig: Take 3 capsules (300 mg total) by mouth at bedtime as needed.    Dispense:  90 capsule    Refill:  11     You can take Tylenol/or Aleve as needed with Gabapentin for severe pain.

## 2021-05-13 NOTE — Progress Notes (Signed)
Chief Complaint  Patient presents with   Follow-up    Rm 15. Alone. PCP is Dr. Jonathon Jordan. Pt is concerned about neuropathy. States she has a pinched nerve in back. Received an injection from doctor which relieved pain for a few days, then pain returned. Has a tender and sore muscle in left leg and left foot. Pt c/o pain in left hip.       ASSESSMENT AND PLAN  Lori Webb is a 81 y.o. 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 Laboratory evaluations in September 2022, normal C-reactive protein, cholesterol 219, normal TSH, ESR of 6, CMP creatinine of 0.9, CBC hemoglobin of 15.1, B12 of 482  MRI cervical in Nov 2022 1. Multilevel cervical spondylosis with resultant mild spinal stenosis at C4-5 through C6-7. 2. Multifactorial degenerative changes with resultant mild to moderate bilateral C5 through C7 foraminal stenosis as detailed above MEDICAL HISTORY:  Lori Webb is a 81 year old female, seen in request by her primary care physician Dr. Stephanie Acre, [place] Booty, for evaluation of bilateral lower extremity paresthesia, frequent leg muscle cramping, initial evaluation was on March 08, 2021.  I reviewed and summarized the referring note.  Past medical history Hypothyroidism, on supplement,  Patient still work at [employer], also drives to visit her husband almost daily basis, very active, without any difficulty, but complains of difficulty sleeping, tired,  In October 2022, she had  a sudden onset of neck pain, radiating pain to left upper extremity, lasting for 2 weeks, I personally reviewed MRI of cervical spine, multilevel degenerative changes, no significant canal stenosis, mild to moderate bilateral C5-6 foraminal narrowing, left cervical radicular pain was eventually with the treatment at her primary care's office, I assume it was a Toradol injection  She did have a history of intermittent low back pain, over the summer 2022, she was wearing thin soled sandals, also walk long distance, mildly dehydrated, during that period of time, she experienced frequent neck muscle cramping, more to left side, sometimes woke her up in the middle of the sleep, significant pain, she has to get up pacing around, at its peak it happened couple times each week  Now she worries more cushioned shoes, keep better hydration, her symptoms has much improved, she denies significant pain, denies bowel and bladder incontinence, mild intermittent bilateral plantar surface especially ball of her feet paresthesia,  UPDATE May 13 2021: Following last visit in November 2022, she had a flareup of her worsening left-sided low back pain, radiating pain to left hip, left lower extremity, was seen by [facility] orthopedic surgeon, ordered MRI of left hip, lumbar spine, was done at [facility] reviewed MRI of lumbar March 26, 2021, multilevel degenerative changes, most severe at L4-5, facet arthropathy, disc bulging with a left far lateral foraminal disc extrusion causing severe neuroforaminal narrowing, with impingement upon the exiting nerve roots, moderate central canal stenosis  MRI of left hip showed mild osteoarthritis, left-sided labral tear,  She received first left lower lumbar epidural injection in  January, with temporary relief, now with flareup of left-sided low back pain, radiating pain to left lateral leg, left first toe  Gait limitations due to pain, denies bowel and bladder  incontinence, has following up with pain management again    PHYSICAL EXAM:   Vitals:   05/13/21 1548  BP: 118/72  Pulse: 78  Weight: 152 lb 8 oz (69.2 kg)  Height: _0  (1.575 m)   Not recorded     Body mass index is 27.89 kg/m.  PHYSICAL EXAMNIATION:  Gen: NAD, conversant, well nourised, well groomed                     Cardiovascular: Regular rate rhythm, no peripheral edema, warm, nontender. Eyes: Conjunctivae clear without exudates or hemorrhage Neck: Supple, no carotid bruits. Pulmonary: Clear to auscultation bilaterally   NEUROLOGICAL EXAM:  MENTAL STATUS: Speech/cognition: Awake, alert, oriented to history taking and casual conversation   CRANIAL NERVES: CN II: Visual fields are full to confrontation. Pupils are round equal and briskly reactive to light. CN III, IV, VI: extraocular movement are normal. No ptosis. CN V: Facial sensation is intact to light touch CN VII: Face is symmetric with normal eye closure  CN VIII: Hearing is normal to causal conversation. CN IX, X: Phonation is normal. CN XI: Head turning and shoulder shrug are intact  MOTOR: There is no pronator drift of out-stretched arms. Muscle bulk and tone are normal. Muscle strength is normal.  REFLEXES: Reflexes are 2+ and symmetric at the biceps, triceps, knees, and ankles. Plantar responses are flexor.  SENSORY: Decreased light touch at left first toe  COORDINATION: There is no trunk or limb dysmetria noted.  GAIT/STANCE: Able to get up from seated position arm crossed, gait is steady with normal steps, base, arm swing, and turning. Heel and toe walking are normal. Tandem gait is normal.  Romberg is absent.  REVIEW OF SYSTEMS:  Full 14 system review of systems performed and notable only for as above All other review of systems were negative.   ALLERGIES: Allergies  Allergen Reactions   Crestor [Rosuvastatin]     HOME MEDICATIONS: Current Outpatient Medications  Medication  Sig Dispense Refill   levothyroxine (SYNTHROID, LEVOTHROID) 100 MCG tablet Take 100 mcg by mouth daily before breakfast.      No current facility-administered medications for this visit.    PAST MEDICAL HISTORY: Past Medical History:  Diagnosis Date   Anxiety    Cholelithiasis with obstruction 12/12/2012   Diverticulosis    Hx of diverticulitis of colon 12/12/2012   11/21/12 first diagnosis of this.   Hypothyroid 12/12/2012   S/p thyroidectomy for Goiter.    PAST SURGICAL HISTORY: Past Surgical History:  Procedure Laterality Date   ABDOMINAL HYSTERECTOMY     APPENDECTOMY     CESAREAN SECTION     x2   CHOLECYSTECTOMY N/A 12/14/2012   Procedure: LAPAROSCOPIC CHOLECYSTECTOMY WITH INTRAOPERATIVE CHOLANGIOGRAM;  Surgeon: Odis Hollingshead, MD;  Location: [facility];  Service: General;  Laterality: N/A;   COLONOSCOPY     DILATION AND CURETTAGE OF UTERUS     ERCP N/A 12/13/2012   Procedure: ENDOSCOPIC RETROGRADE CHOLANGIOPANCREATOGRAPHY (ERCP);  Surgeon: Gatha Mayer, MD;  Location: [facility];  Service: Endoscopy;  Laterality: N/A;   ERCP N/A 12/16/2012   Procedure: ENDOSCOPIC RETROGRADE CHOLANGIOPANCREATOGRAPHY (ERCP);  Surgeon: Gatha Mayer, MD;  Location: [facility];  Service: Gastroenterology;  Laterality: N/A;   MASS EXCISION  05/02/2012   Procedure: EXCISION MASS;  Surgeon: Harl Bowie, MD;  Location: [facility];  Service: General;  Laterality: Right;  excision cyst right upper back   PLANTAR FASCIA RELEASE  2009   left heel   THYROIDECTOMY     TONSILLECTOMY      FAMILY HISTORY: Family History  Problem Relation Age of Onset   Cancer Mother        breast   Cancer Maternal Grandmother        breast    SOCIAL HISTORY: Social History   Socioeconomic History   Marital status: Married    Spouse name: Not on file   Number of children: Not on file   Years of education: Not on file   Highest education level: Not on file  Occupational History   Not on file   Tobacco Use   Smoking status: Never   Smokeless tobacco: Never  Substance and Sexual Activity   Alcohol use: Yes    Alcohol/week: 1.0 standard drink    Types: 1 Glasses of wine per week    Comment: 1 glass of wine per night   Drug use: No   Sexual activity: Never  Other Topics Concern   Not on file  Social History Narrative   Not on file   Social Determinants of Health   Financial Resource Strain: Not on file  Food Insecurity: Not on file  Transportation Needs: Not on file  Physical Activity: Not on file  Stress: Not on file  Social Connections: Not on file  Intimate Partner Violence: Not on file      Marcial Pacas, M.D. Ph.D.  [facility] [address], [place], [place] [address] Ph: [phone] Fax: [phone]  CC:  Jonathon Jordan, MD [place],  [place] [address]  Jonathon Jordan, MD

## 2021-05-14 DIAGNOSIS — M5416 Radiculopathy, lumbar region: Secondary | ICD-10-CM | POA: Diagnosis not present

## 2021-06-24 ENCOUNTER — Ambulatory Visit: Payer: Medicare Other | Admitting: Neurology

## 2021-07-07 DIAGNOSIS — M5416 Radiculopathy, lumbar region: Secondary | ICD-10-CM | POA: Diagnosis not present

## 2021-09-09 DIAGNOSIS — I700 Atherosclerosis of aorta: Secondary | ICD-10-CM | POA: Diagnosis not present

## 2021-09-09 DIAGNOSIS — M5412 Radiculopathy, cervical region: Secondary | ICD-10-CM | POA: Diagnosis not present

## 2021-09-09 DIAGNOSIS — E039 Hypothyroidism, unspecified: Secondary | ICD-10-CM | POA: Diagnosis not present

## 2021-09-09 DIAGNOSIS — F411 Generalized anxiety disorder: Secondary | ICD-10-CM | POA: Diagnosis not present

## 2021-10-01 ENCOUNTER — Other Ambulatory Visit: Payer: Self-pay | Admitting: Surgery

## 2021-10-01 DIAGNOSIS — L089 Local infection of the skin and subcutaneous tissue, unspecified: Secondary | ICD-10-CM | POA: Diagnosis not present

## 2021-10-01 DIAGNOSIS — L723 Sebaceous cyst: Secondary | ICD-10-CM | POA: Diagnosis not present

## 2021-10-11 NOTE — Patient Instructions (Addendum)
DUE TO SPACE LIMITATIONS, ONLY TWO VISITORS  (aged 81 and older) ARE ALLOWED TO COME WITH YOU AND STAY IN THE WAITING ROOM DURING YOUR PRE OP AND PROCEDURE.   **NO VISITORS ARE ALLOWED IN THE SHORT STAY AREA OR RECOVERY ROOM!!**  You are not required to quarantine at this time prior to your surgery. However, you must do this: Hand Hygiene often Do NOT share personal items Notify your provider if you are in close contact with someone who has COVID or you develop fever 100.4 or greater, new onset of sneezing, cough, sore throat, shortness of breath or body aches.       Your procedure is scheduled on: Thursday October 14, 2021   Report to [facility] Main Entrance.  Report to admitting at:  07:45 AM  +++++Call this number if you have any questions or problems the morning of surgery [phone]  Do not eat food :After Midnight the night prior to your surgery/procedure.  After Midnight you may have the following liquids until   07:00 AM DAY OF SURGERY  Clear Liquid Diet Water Black Coffee (sugar ok, NO MILK/CREAM OR CREAMERS)  Tea (sugar ok, NO MILK/CREAM OR CREAMERS) regular and decaf                             Plain Jell-O (NO RED)                                           Fruit ices (not with fruit pulp, NO RED)                                     Popsicles (NO RED)                                                                  Juice: apple, WHITE grape, WHITE cranberry Sports drinks like Gatorade (NO RED) Clear broth(vegetable,chicken,beef)                   The day of surgery:  Drink ONE (1) Pre-Surgery Clear Ensure at   07:00  AM the morning of surgery. Drink in one sitting. Do not sip.  This drink was given to you during your hospital  pre-op appointment visit. Nothing else to drink after completing the Pre-Surgery Clear Ensure .   If you have questions, please contact your surgeon's office.   FOLLOW ANY ADDITIONAL PRE OP INSTRUCTIONS YOU RECEIVED FROM YOUR  SURGEON'S OFFICE!!!   Oral Hygiene is also important to reduce your risk of infection.        Remember - BRUSH YOUR TEETH THE MORNING OF SURGERY WITH YOUR REGULAR TOOTHPASTE   Take ONLY these medicines the morning of surgery with A SIP OF WATER: Duloxetine (Cymbalta), Levothyroxine (Synthroid). You may take Tylenol if needed.   You may not have any metal on your body including hair pins, jewelry, and body piercing  Do not wear make-up, lotions, powders, perfumes, or deodorant  Do not wear nail polish including gel and S&S,  artificial / acrylic nails, or any other type of covering on natural nails including finger and toenails. If you have artificial nails, gel coating, etc., that needs to be removed by a nail salon, Please have this removed prior to surgery. Not doing so may mean that your surgery could be cancelled or delayed if the Surgeon or anesthesia staff feels like they are unable to monitor you safely.   Do not shave 48 hours prior to surgery to avoid nicks in your skin which may contribute to postoperative infections.   DO NOT [place]. PHARMACY WILL DISPENSE MEDICATIONS LISTED ON YOUR MEDICATION LIST TO YOU DURING YOUR ADMISSION [facility]!   Patients discharged on the day of surgery will not be allowed to drive home.  Someone NEEDS to stay with you for the first 24 hours after anesthesia.  Special Instructions: Bring a copy of your healthcare power of attorney and living will documents the day of surgery, if you wish to have them scanned into your [facility] Records- EPIC  Please read over the following fact sheets you were given: IF YOU HAVE QUESTIONS ABOUT YOUR PRE-OP INSTRUCTIONS, PLEASE CALL [phone]  ([facility])   [facility] - Preparing for Surgery Before surgery, you can play an important role.  Because skin is not sterile, your skin needs to be as free of germs as possible.  You can reduce the number of germs on your skin by  washing with CHG (chlorahexidine gluconate) soap before surgery.  CHG is an antiseptic cleaner which kills germs and bonds with the skin to continue killing germs even after washing. Please DO NOT use if you have an allergy to CHG or antibacterial soaps.  If your skin becomes reddened/irritated stop using the CHG and inform your nurse when you arrive at Short Stay. Do not shave (including legs and underarms) for at least 48 hours prior to the first CHG shower.  You may shave your face/neck.  Please follow these instructions carefully:  1.  Shower with CHG Soap the night before surgery and the  morning of surgery.  2.  If you choose to wash your hair, wash your hair first as usual with your normal  shampoo.  3.  After you shampoo, rinse your hair and body thoroughly to remove the shampoo.                             4.  Use CHG as you would any other liquid soap.  You can apply chg directly to the skin and wash.  Gently with a scrungie or clean washcloth.  5.  Apply the CHG Soap to your body ONLY FROM THE NECK DOWN.   Do not use on face/ open                           Wound or open sores. Avoid contact with eyes, ears mouth and genitals (private parts).                       Wash face,  Genitals (private parts) with your normal soap.             6.  Wash thoroughly, paying special attention to the area where your  surgery  will be performed.  7.  Thoroughly rinse your body with warm water from the neck down.  8.  DO  NOT shower/wash with your normal soap after using and rinsing off the CHG Soap.            9.  Pat yourself dry with a clean towel.            10.  Wear clean pajamas.            11.  Place clean sheets on your bed the night of your first shower and do not  sleep with pets.  ON THE DAY OF SURGERY : Do not apply any lotions/deodorants the morning of surgery.  Please wear clean clothes to the hospital/surgery center.    FAILURE TO FOLLOW THESE INSTRUCTIONS MAY RESULT IN THE  CANCELLATION OF YOUR SURGERY  PATIENT SIGNATURE_________________________________  NURSE SIGNATURE__________________________________  ________________________________________________________________________

## 2021-10-11 NOTE — Progress Notes (Signed)
COVID Vaccine received:  '[]'$  No '[x]'$  Yes  Date of any COVID positive Test in last 90 days: none  PCP - Jonathon Jordan, MD Cardiologist - none  Chest x-ray - 2014  Epic EKG -  2014 Epic Stress Test - None ECHO - None Cardiac Cath -  None  Pacemaker/ICD device last checked:     '[x]'$  N/A Spinal Cord Stimulator:'[x]'$  No '[]'$  Yes   Other Implants:   History of Sleep Apnea? '[x]'$  No  Sleep Study Date:   None  Does the patient monitor blood sugar? '[x]'$  No '[]'$  Yes  '[x]'$  N/A  Blood Thinner Instructions: none Aspirin Instructions: Last Dose:  Activity level:  Can go up a flight of stairs and perform activities of daily living without stopping and       without symptoms of chest pain or shortness of breath.'[]'$  No '[x]'$  Yes  '[]'$  N/A  Able to do exercises without symptoms '[x]'$  Yes  '[]'$  N/A  Patient able to complete ADLs without assistance '[]'$  No '[x]'$  Yes  '[]'$  N/A    Comments:   Anesthesia review:   Patient denies shortness of breath, fever, cough and chest pain at PAT appointment  Patient verbalized understanding and agreement to the Pre-Surgical Instructions that were given to them at this PAT appointment. Patient was also educated of the need to review these PAT instructions again prior to his/her surgery.I reviewed the appropriate phone numbers to call if they have any and questions or concerns.

## 2021-10-13 ENCOUNTER — Other Ambulatory Visit: Payer: Self-pay

## 2021-10-13 ENCOUNTER — Encounter (HOSPITAL_COMMUNITY): Payer: Self-pay

## 2021-10-13 ENCOUNTER — Encounter (HOSPITAL_COMMUNITY)
Admission: RE | Admit: 2021-10-13 | Discharge: 2021-10-13 | Disposition: A | Discharge status: Home / Self Care | Payer: Medicare Other | Source: Ambulatory Visit | Attending: Surgery | Admitting: Surgery

## 2021-10-13 DIAGNOSIS — Z01818 Encounter for other preprocedural examination: Secondary | ICD-10-CM | POA: Insufficient documentation

## 2021-10-13 HISTORY — DX: Unspecified osteoarthritis, unspecified site: M19.90

## 2021-10-13 NOTE — H&P (Signed)
  PROVIDER: Beverlee Nims, MD  MRN: [id number] DOB: Apr 26, 1940 DATE OF ENCOUNTER: 10/01/2021 Subjective   Chief Complaint: New Patient (Right upper back cyst )   History of Present Illness: Lori Webb is a 81 y.o. female who is seen today for a recurrent sebaceous cyst on her upper back.  I excised a large sebaceous cyst on her upper back in 2014. It has now recurred. She reports it causes discomfort and occasional have erythema around it but has not drained. She feels like the skin is tight around the area as well. She is otherwise been doing very well and has no cardiopulmonary issues.    Review of Systems: A complete review of systems was obtained from the patient. I have reviewed this information and discussed as appropriate with the patient. See HPI as well for other ROS.  ROS   Medical History: Past Medical History:  Diagnosis Date  Thyroid disease   There is no problem list on file for this patient.  Past Surgical History:  Procedure Laterality Date  HYSTERECTOMY  THYROIDECTOMY TOTAL    No Known Allergies  Current Outpatient Medications on File Prior to Visit  Medication Sig Dispense Refill  ciprofloxacin HCl (CIPRO) 500 MG tablet ciprofloxacin 500 mg tablet  DULoxetine (CYMBALTA) 60 MG DR capsule Take 60 mg by mouth once daily  fluconazole (DIFLUCAN) 150 MG tablet fluconazole 150 mg tablet  gabapentin (NEURONTIN) 100 MG capsule TAKE 3 CAPSULES BY MOUTH AT BEDTIME AS NEEDED  levoFLOXacin (LEVAQUIN) 500 MG tablet levofloxacin 500 mg tablet   No current facility-administered medications on file prior to visit.   History reviewed. No pertinent family history.   Social History   Tobacco Use  Smoking Status Never  Smokeless Tobacco Never    Social History   Socioeconomic History  Marital status: Married  Tobacco Use  Smoking status: Never  Smokeless tobacco: Never  Substance and Sexual Activity  Alcohol use: Yes  Drug use: Never    Objective:   Vitals:  10/01/21 0931  BP: 120/76  Pulse: 102  Temp: 36.7 C (98.1 F)  SpO2: 98%  Weight: 70.3 kg (155 lb)  Height: 157.5 cm ('5\' 2"'$ )   Body mass index is 28.35 kg/m.  Physical Exam   She looks well today and younger than her stated age  There is a 2-1/2 cm tender cyst on her right upper back just below her previous scar. There is very slight induration but no erythema today. It is only slightly mobile  Labs, Imaging and Diagnostic Testing:  I reviewed her notes in the electronic medical records  Assessment and Plan:   Diagnoses and all orders for this visit:  Infected sebaceous cyst of skin    At this point, this is a recurrent cyst and surgical excision is recommended for complete histologic evaluation and to rule out malignancy and also prevent ongoing infections as she has had erythema with this in the past. We again discussed the risk of surgery which includes but is not limited to bleeding, infection, injury to surrounding structures, recurrence, postoperative recovery, etc. She understands and wishes to proceed with surgery which will be scheduled

## 2021-10-14 ENCOUNTER — Other Ambulatory Visit: Payer: Self-pay

## 2021-10-14 ENCOUNTER — Ambulatory Visit (HOSPITAL_COMMUNITY): Payer: Medicare Other | Admitting: Certified Registered"

## 2021-10-14 ENCOUNTER — Ambulatory Visit (HOSPITAL_BASED_OUTPATIENT_CLINIC_OR_DEPARTMENT_OTHER): Payer: Medicare Other | Admitting: Certified Registered"

## 2021-10-14 ENCOUNTER — Encounter (HOSPITAL_COMMUNITY)
Admission: RE | Disposition: A | Discharge status: Home / Self Care | Payer: Self-pay | Source: Home / Self Care | Attending: Surgery

## 2021-10-14 ENCOUNTER — Ambulatory Visit (HOSPITAL_COMMUNITY)
Admission: RE | Admit: 2021-10-14 | Discharge: 2021-10-14 | Disposition: A | Discharge status: Home / Self Care | Payer: Medicare Other | Attending: Surgery | Admitting: Surgery

## 2021-10-14 ENCOUNTER — Encounter (HOSPITAL_COMMUNITY): Payer: Self-pay | Admitting: Surgery

## 2021-10-14 DIAGNOSIS — F419 Anxiety disorder, unspecified: Secondary | ICD-10-CM | POA: Diagnosis not present

## 2021-10-14 DIAGNOSIS — E039 Hypothyroidism, unspecified: Secondary | ICD-10-CM | POA: Insufficient documentation

## 2021-10-14 DIAGNOSIS — L720 Epidermal cyst: Secondary | ICD-10-CM | POA: Insufficient documentation

## 2021-10-14 DIAGNOSIS — M1990 Unspecified osteoarthritis, unspecified site: Secondary | ICD-10-CM | POA: Insufficient documentation

## 2021-10-14 DIAGNOSIS — L723 Sebaceous cyst: Secondary | ICD-10-CM | POA: Diagnosis not present

## 2021-10-14 HISTORY — PX: CYST EXCISION: SHX5701

## 2021-10-14 SURGERY — CYST REMOVAL
Anesthesia: Monitor Anesthesia Care

## 2021-10-14 MED ORDER — ENSURE PRE-SURGERY PO LIQD
296.0000 mL | Freq: Once | ORAL | Status: DC
  Filled 2021-10-14: qty 296

## 2021-10-14 MED ORDER — CEFAZOLIN SODIUM-DEXTROSE 2-4 GM/100ML-% IV SOLN
2.0000 g | INTRAVENOUS | Status: AC
  Administered 2021-10-14: 2 g via INTRAVENOUS
  Filled 2021-10-14: qty 100

## 2021-10-14 MED ORDER — FENTANYL CITRATE PF 50 MCG/ML IJ SOSY: 25.0000 ug | PREFILLED_SYRINGE | INTRAMUSCULAR | Status: DC | PRN

## 2021-10-14 MED ORDER — FENTANYL CITRATE (PF) 100 MCG/2ML IJ SOLN
INTRAMUSCULAR | Status: AC
  Filled 2021-10-14: qty 2

## 2021-10-14 MED ORDER — CHLORHEXIDINE GLUCONATE CLOTH 2 % EX PADS: 6.0000 | MEDICATED_PAD | Freq: Once | CUTANEOUS | Status: DC

## 2021-10-14 MED ORDER — LIDOCAINE HCL (PF) 1 % IJ SOLN
INTRAMUSCULAR | Status: DC | PRN
  Administered 2021-10-14: 15 mL

## 2021-10-14 MED ORDER — GLYCOPYRROLATE 0.2 MG/ML IJ SOLN
INTRAMUSCULAR | Status: DC | PRN
  Administered 2021-10-14: .2 mg via INTRAVENOUS

## 2021-10-14 MED ORDER — PROPOFOL 10 MG/ML IV BOLUS
INTRAVENOUS | Status: DC | PRN
  Administered 2021-10-14: 40 mg via INTRAVENOUS

## 2021-10-14 MED ORDER — ACETAMINOPHEN 500 MG PO TABS
1000.0000 mg | ORAL_TABLET | ORAL | Status: AC
  Administered 2021-10-14: 1000 mg via ORAL
  Filled 2021-10-14: qty 2

## 2021-10-14 MED ORDER — CHLORHEXIDINE GLUCONATE 0.12 % MT SOLN
15.0000 mL | Freq: Once | OROMUCOSAL | Status: AC
  Administered 2021-10-14: 15 mL via OROMUCOSAL

## 2021-10-14 MED ORDER — TRAMADOL HCL 50 MG PO TABS: 50.0000 mg | ORAL_TABLET | Freq: Four times a day (QID) | ORAL | 0 refills | Status: AC | PRN

## 2021-10-14 MED ORDER — PROPOFOL 500 MG/50ML IV EMUL
INTRAVENOUS | Status: DC | PRN
  Administered 2021-10-14: 50 ug/kg/min via INTRAVENOUS

## 2021-10-14 MED ORDER — LACTATED RINGERS IV SOLN: INTRAVENOUS | Status: DC

## 2021-10-14 MED ORDER — PHENYLEPHRINE HCL-NACL 20-0.9 MG/250ML-% IV SOLN
INTRAVENOUS | Status: DC | PRN
  Administered 2021-10-14: 40 ug/min via INTRAVENOUS

## 2021-10-14 MED ORDER — ORAL CARE MOUTH RINSE: 15.0000 mL | Freq: Once | OROMUCOSAL | Status: AC

## 2021-10-14 MED ORDER — BUPIVACAINE-EPINEPHRINE (PF) 0.25% -1:200000 IJ SOLN
INTRAMUSCULAR | Status: AC
  Filled 2021-10-14: qty 30

## 2021-10-14 MED ORDER — LIDOCAINE HCL (PF) 1 % IJ SOLN
INTRAMUSCULAR | Status: AC
  Filled 2021-10-14: qty 30

## 2021-10-14 MED ORDER — FENTANYL CITRATE (PF) 100 MCG/2ML IJ SOLN
INTRAMUSCULAR | Status: DC | PRN
  Administered 2021-10-14 (×3): 25 ug via INTRAVENOUS

## 2021-10-14 SURGICAL SUPPLY — 27 items
BAG COUNTER SPONGE SURGICOUNT (BAG) IMPLANT
BENZOIN TINCTURE PRP APPL 2/3 (GAUZE/BANDAGES/DRESSINGS) IMPLANT
BLADE SURG 15 STRL LF DISP TIS (BLADE) ×1 IMPLANT
BLADE SURG 15 STRL SS (BLADE) ×2
CHLORAPREP W/TINT 26 (MISCELLANEOUS) ×2 IMPLANT
DERMABOND ADVANCED (GAUZE/BANDAGES/DRESSINGS) ×1
DERMABOND ADVANCED .7 DNX12 (GAUZE/BANDAGES/DRESSINGS) IMPLANT
DRAPE LAPAROSCOPIC ABDOMINAL (DRAPES) ×1 IMPLANT
DRAPE LAPAROTOMY T 98X78 PEDS (DRAPES) IMPLANT
ELECT REM PT RETURN 15FT ADLT (MISCELLANEOUS) ×2 IMPLANT
GAUZE SPONGE 4X4 12PLY STRL (GAUZE/BANDAGES/DRESSINGS) IMPLANT
GLOVE BIO SURGEON STRL SZ7.5 (GLOVE) ×2 IMPLANT
GOWN STRL REUS W/ TWL XL LVL3 (GOWN DISPOSABLE) ×2 IMPLANT
GOWN STRL REUS W/TWL XL LVL3 (GOWN DISPOSABLE) ×6
KIT BASIN OR (CUSTOM PROCEDURE TRAY) ×2 IMPLANT
KIT TURNOVER KIT A (KITS) ×1 IMPLANT
NDL HYPO 25X1 1.5 SAFETY (NEEDLE) ×1 IMPLANT
NEEDLE HYPO 25X1 1.5 SAFETY (NEEDLE) ×2 IMPLANT
PACK BASIC VI WITH GOWN DISP (CUSTOM PROCEDURE TRAY) ×2 IMPLANT
PENCIL SMOKE EVACUATOR (MISCELLANEOUS) ×1 IMPLANT
SPIKE FLUID TRANSFER (MISCELLANEOUS) ×1 IMPLANT
STRIP CLOSURE SKIN 1/2X4 (GAUZE/BANDAGES/DRESSINGS) IMPLANT
SUT MNCRL AB 4-0 PS2 18 (SUTURE) ×2 IMPLANT
SUT VIC AB 3-0 SH 27 (SUTURE) ×2
SUT VIC AB 3-0 SH 27XBRD (SUTURE) ×1 IMPLANT
SYR CONTROL 10ML LL (SYRINGE) ×2 IMPLANT
TOWEL OR 17X26 10 PK STRL BLUE (TOWEL DISPOSABLE) ×2 IMPLANT

## 2021-10-14 NOTE — Anesthesia Preprocedure Evaluation (Signed)
Anesthesia Evaluation  Patient identified by MRN, date of birth, ID band Patient awake    Reviewed: Allergy & Precautions, NPO status , Patient's Chart, lab work & pertinent test results  Airway Mallampati: II  TM Distance: >3 FB Neck ROM: Full    Dental no notable dental hx.    Pulmonary neg pulmonary ROS,    Pulmonary exam normal        Cardiovascular negative cardio ROS   Rhythm:Regular Rate:Normal     Neuro/Psych Anxiety negative neurological ROS     GI/Hepatic negative GI ROS, Neg liver ROS,   Endo/Other  Hypothyroidism   Renal/GU negative Renal ROS  negative genitourinary   Musculoskeletal  (+) Arthritis , Osteoarthritis,    Abdominal Normal abdominal exam  (+)   Peds  Hematology negative hematology ROS (+)   Anesthesia Other Findings   Reproductive/Obstetrics                             Anesthesia Physical Anesthesia Plan  ASA: 2  Anesthesia Plan: MAC   Post-op Pain Management:    Induction: Intravenous  PONV Risk Score and Plan: 2 and Propofol infusion and Treatment may vary due to age or medical condition  Airway Management Planned: Simple Face Mask, Natural Airway and Nasal Cannula  Additional Equipment: None  Intra-op Plan:   Post-operative Plan:   Informed Consent: I have reviewed the patients History and Physical, chart, labs and discussed the procedure including the risks, benefits and alternatives for the proposed anesthesia with the patient or authorized representative who has indicated his/her understanding and acceptance.     Dental advisory given  Plan Discussed with: CRNA  Anesthesia Plan Comments:         Anesthesia Quick Evaluation

## 2021-10-14 NOTE — Transfer of Care (Signed)
Immediate Anesthesia Transfer of Care Note  Patient: Lori Webb  Procedure(s) Performed: EXCISION RIGHT UPPER BACK CYST  Patient Location: PACU  Anesthesia Type:MAC  Level of Consciousness: alert   Airway & Oxygen Therapy: Patient Spontanous Breathing  Post-op Assessment: Report given to RN  Post vital signs: stable  Last Vitals:  Vitals Value Taken Time  BP 123/57 10/14/21 1109  Temp    Pulse 76 10/14/21 1110  Resp 13 10/14/21 1110  SpO2 100 % 10/14/21 1110  Vitals shown include unvalidated device data.  Last Pain:  Vitals:   10/14/21 0835  TempSrc:   PainSc: 7          Complications: No notable events documented.

## 2021-10-14 NOTE — Discharge Instructions (Signed)
You may shower starting tomorrow  No vigorous activity for 1 week  Tylenol, ibuprofen, and ice pack also for pain

## 2021-10-14 NOTE — Interval H&P Note (Signed)
History and Physical Interval Note:no change in H and P  10/14/2021 9:21 AM  Lori Webb  has presented today for surgery, with the diagnosis of RIGHT UPPER BACK CHRONICALLY INFECTED SEBACEOUS CYST.  The various methods of treatment have been discussed with the patient and family. After consideration of risks, benefits and other options for treatment, the patient has consented to  Procedure(s): EXCISION RIGHT UPPER BACK CYST (N/A) as a surgical intervention.  The patient's history has been reviewed, patient examined, no change in status, stable for surgery.  I have reviewed the patient's chart and labs.  Questions were answered to the patient's satisfaction.     Coralie Keens

## 2021-10-14 NOTE — Anesthesia Postprocedure Evaluation (Signed)
Anesthesia Post Note  Patient: Lori Webb  Procedure(s) Performed: EXCISION RIGHT UPPER BACK CYST     Patient location during evaluation: PACU Anesthesia Type: MAC Level of consciousness: awake and alert Pain management: pain level controlled Vital Signs Assessment: post-procedure vital signs reviewed and stable Respiratory status: spontaneous breathing, nonlabored ventilation, respiratory function stable and patient connected to nasal cannula oxygen Cardiovascular status: stable and blood pressure returned to baseline Postop Assessment: no apparent nausea or vomiting Anesthetic complications: no   No notable events documented.  Last Vitals:  Vitals:   10/14/21 1130 10/14/21 1140  BP: (!) 143/64 (!) 159/63  Pulse: 65 64  Resp: 14 16  Temp: (!) 36.1 C (!) 36.3 C  SpO2: 99% 96%    Last Pain:  Vitals:   10/14/21 1140  TempSrc:   PainSc: 0-No pain                 Belenda Cruise P Reason Helzer

## 2021-10-14 NOTE — Op Note (Signed)
   SHARMA LAWRANCE 10/14/2021   Pre-op Diagnosis: RIGHT UPPER BACK CHRONICALLY INFECTED SEBACEOUS CYST     Post-op Diagnosis: same  Procedure(s): EXCISION RIGHT UPPER BACK CYST ( 3 cm )  Surgeon(s): Coralie Keens, MD  Anesthesia: Monitor Anesthesia Care  Staff:  Circulator: Stana Bunting, RN Scrub Person: Ophelia Charter, CST  Estimated Blood Loss: Minimal               Specimens: 3 cm sebaceous cyst sent to pathology  Procedure: The patient was brought to operating identifies correct patient.  She was placed upon the operating table and then a bump was placed underneath her right shoulder and she was turned slightly left lateral.  Anesthesia was induced.  The palpable large cyst on her upper right back was then prepped and draped in usual sterile fashion.  I anesthetized skin with lidocaine.  I made elliptical incision with a scalpel and then took this down through the subcutaneous tissue with a scalpel and the cautery.  I then remove the entire sebaceous cyst including the sebaceous contents and capsule with the electrocautery.  I thoroughly evaluated the wound and saw no further capsule or sebaceous debris.  The wound was irrigated.  I anesthetized it further with lidocaine.  Hemostasis was achieved with the cautery.  I then closed the subcutaneous tissue with interrupted 3-0 Vicryl sutures and closed the skin with running and interrupted 4-0 Monocryl sutures.  Dermabond was then applied.  The patient tolerated the procedure well.  All the counts were correct at the end of the procedure.  The patient was then taken in a stable condition from the operating room to the recovery room.          Coralie Keens   Date: 10/14/2021  Time: 11:02 AM

## 2021-10-15 ENCOUNTER — Encounter (HOSPITAL_COMMUNITY): Payer: Self-pay | Admitting: Surgery

## 2021-10-15 LAB — SURGICAL PATHOLOGY

## 2021-10-19 DIAGNOSIS — M5416 Radiculopathy, lumbar region: Secondary | ICD-10-CM | POA: Diagnosis not present

## 2021-10-25 ENCOUNTER — Telehealth: Payer: Self-pay | Admitting: Neurology

## 2021-10-25 DIAGNOSIS — M5416 Radiculopathy, lumbar region: Secondary | ICD-10-CM

## 2021-10-25 NOTE — Telephone Encounter (Signed)
Dr. 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

## 2021-10-25 NOTE — Telephone Encounter (Signed)
Pt states she has taken DULoxetine (CYMBALTA) 60 MG capsule, but she is still in pain.  Pt is asking for a call to discuss

## 2021-10-25 NOTE — Telephone Encounter (Signed)
I spoke to the patient. She is still taking duloxetine '60mg'$  daily and gabapentin '100mg'$ , three capsules QHS. She has also tried three epidural steroid injections. She gets limited relief for no more than three months with the ESI's. She would like to proceed with a neurosurgical referral. She wants to at least have a consultation with them to explore her options.

## 2021-10-28 NOTE — Telephone Encounter (Signed)
Error

## 2021-10-29 NOTE — Telephone Encounter (Signed)
Orders Placed This Encounter  Procedures   Ambulatory referral to Neurosurgery   Please let patient know I have put a neurosurgery referral

## 2021-10-29 NOTE — Telephone Encounter (Signed)
The patient is aware to expect a call for scheduling.

## 2021-10-29 NOTE — Addendum Note (Signed)
Addended by: Marcial Pacas on: 10/29/2021 09:57 AM   Modules accepted: Orders

## 2021-11-02 ENCOUNTER — Telehealth: Payer: Self-pay | Admitting: Neurology

## 2021-11-02 NOTE — Telephone Encounter (Signed)
Referral for Neurosurgery sent to  [facility] [phone]. 

## 2021-11-10 DIAGNOSIS — M5116 Intervertebral disc disorders with radiculopathy, lumbar region: Secondary | ICD-10-CM | POA: Diagnosis not present

## 2021-12-02 DIAGNOSIS — M5126 Other intervertebral disc displacement, lumbar region: Secondary | ICD-10-CM | POA: Diagnosis not present

## 2021-12-02 DIAGNOSIS — M5116 Intervertebral disc disorders with radiculopathy, lumbar region: Secondary | ICD-10-CM | POA: Diagnosis not present

## 2021-12-22 DIAGNOSIS — Z1231 Encounter for screening mammogram for malignant neoplasm of breast: Secondary | ICD-10-CM | POA: Diagnosis not present

## 2022-03-14 DIAGNOSIS — E039 Hypothyroidism, unspecified: Secondary | ICD-10-CM | POA: Diagnosis not present

## 2022-03-14 DIAGNOSIS — Z79899 Other long term (current) drug therapy: Secondary | ICD-10-CM | POA: Diagnosis not present

## 2022-03-14 DIAGNOSIS — N3000 Acute cystitis without hematuria: Secondary | ICD-10-CM | POA: Diagnosis not present

## 2022-03-14 DIAGNOSIS — M2550 Pain in unspecified joint: Secondary | ICD-10-CM | POA: Diagnosis not present

## 2022-03-16 IMAGING — MR MR CERVICAL SPINE W/O CM
4 of 5 series · 27 of 48 positions shown · non-contrast
Comparison: None available.

CLINICAL DATA: Initial evaluation for neck pain with left shoulder
pain and radiation into the left upper extremity.

EXAM:
MRI CERVICAL SPINE WITHOUT CONTRAST
TECHNIQUE: Multiplanar, multisequence MR imaging of the cervical spine was
performed. No intravenous contrast was administered.

[Series 5: T2 · sagittal · 3.0mm · 0.55mm/px · 6 of 15 slices shown (1 of 2)]
[im 1/15]
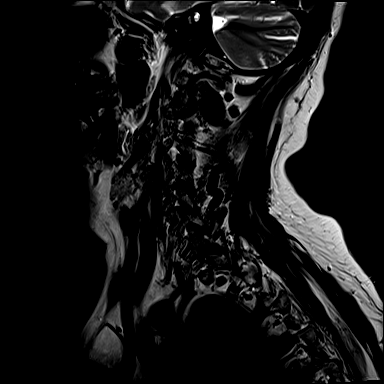
[im 3/15]
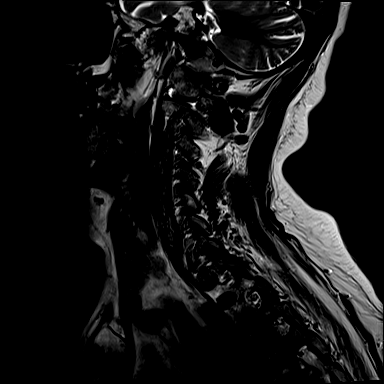
[im 6/15]
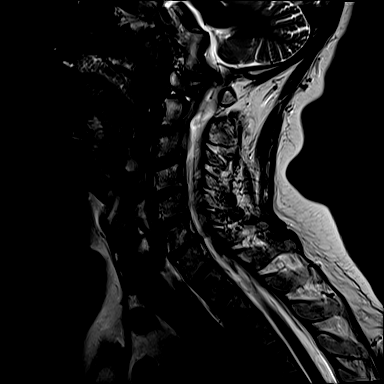
[im 9/15]
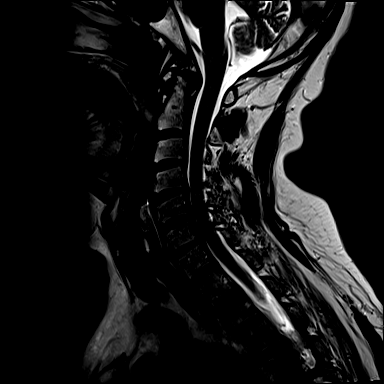
[im 12/15]
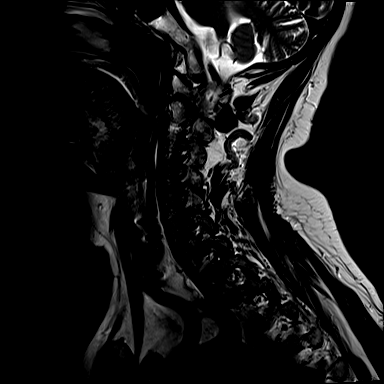
[im 15/15]
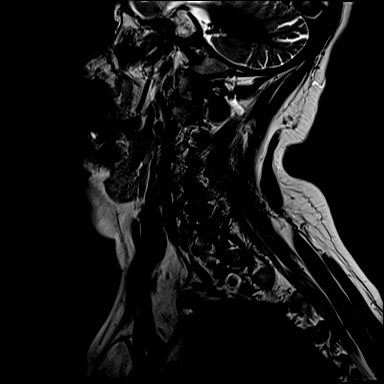

[Series 6: T1 · sagittal · 3.0mm · 0.66mm/px · 7 of 15 slices shown]
[im 1/15]
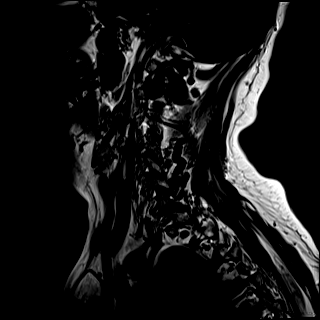
[im 3/15]
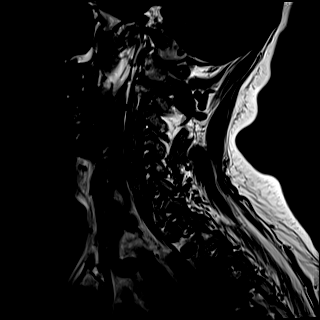
[im 5/15]
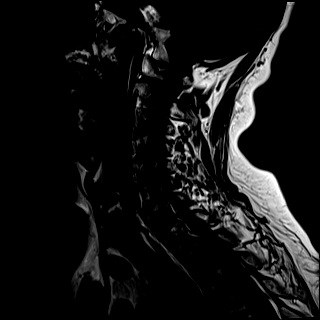
[im 8/15]
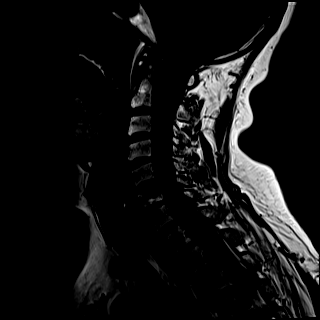
[im 10/15]
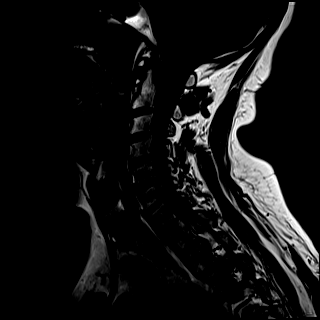
[im 12/15]
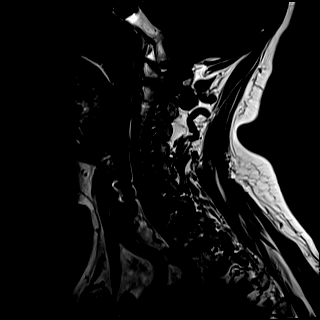
[im 15/15]
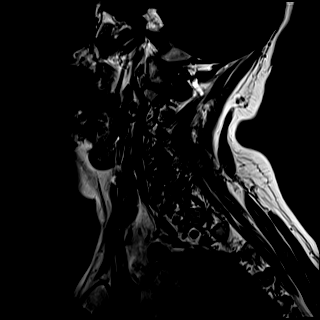

[Series 11: STIR · sagittal · 3.0mm · 0.33mm/px · 6 of 15 slices shown]
[im 1/15]
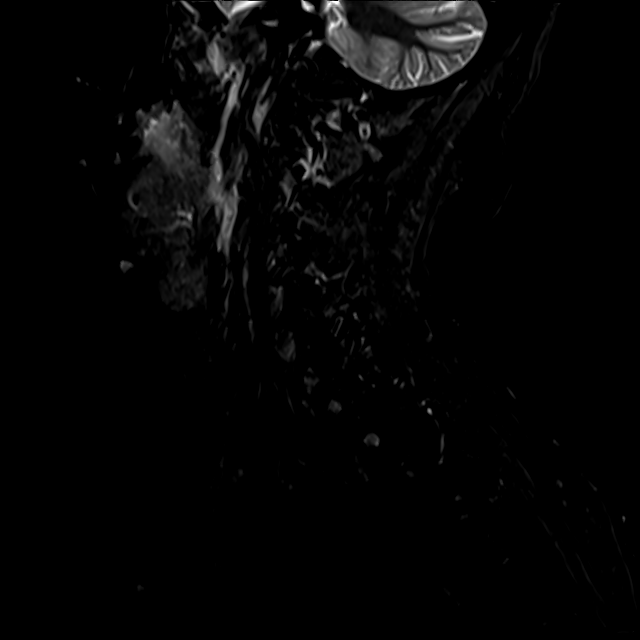
[im 3/15]
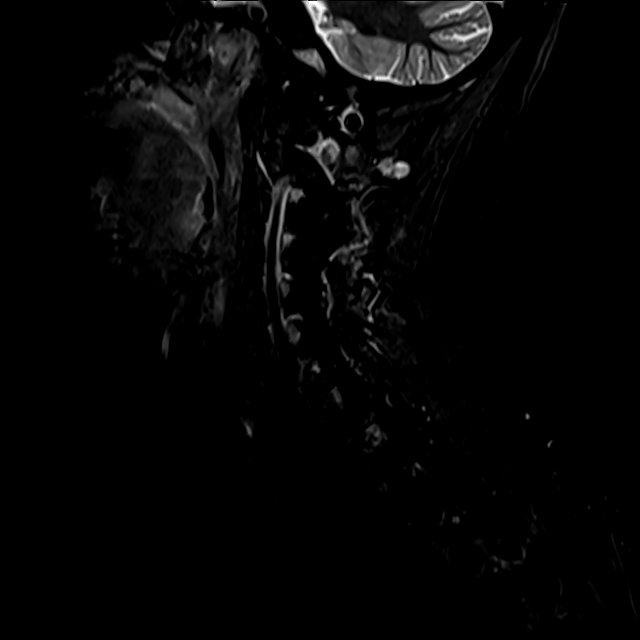
[im 5/15]
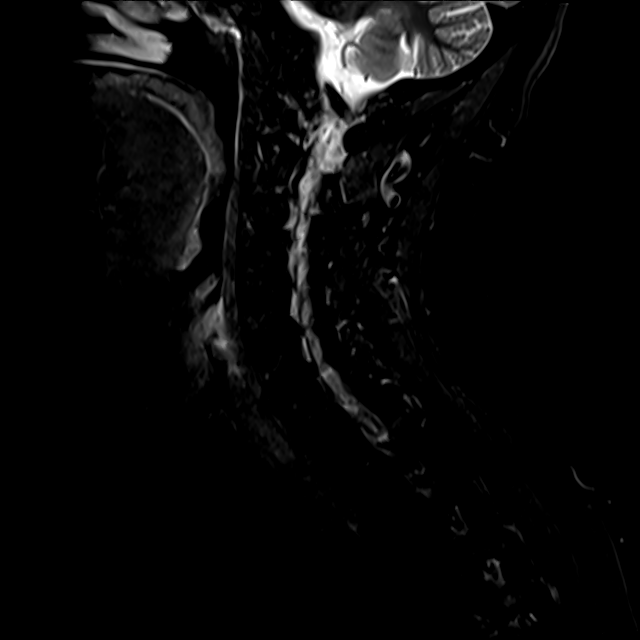
[im 8/15]
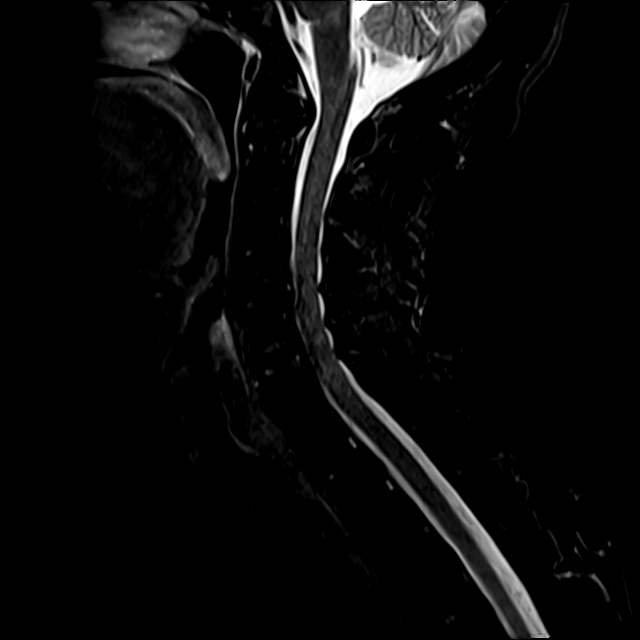
[im 10/15]
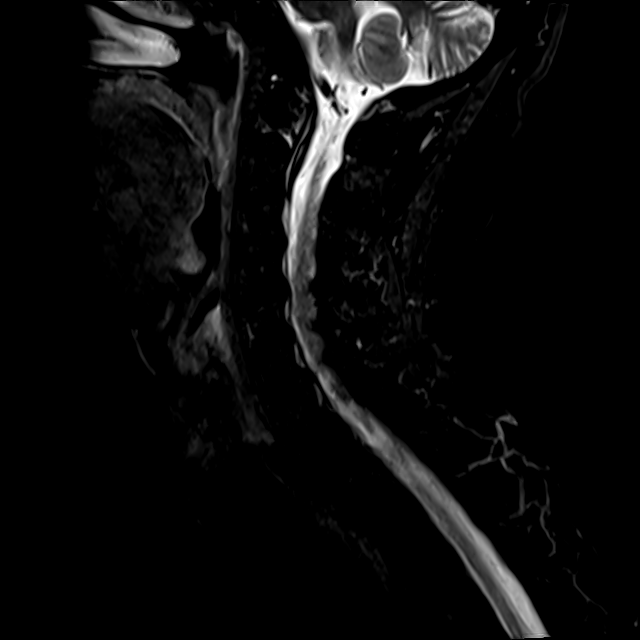
[im 12/15]
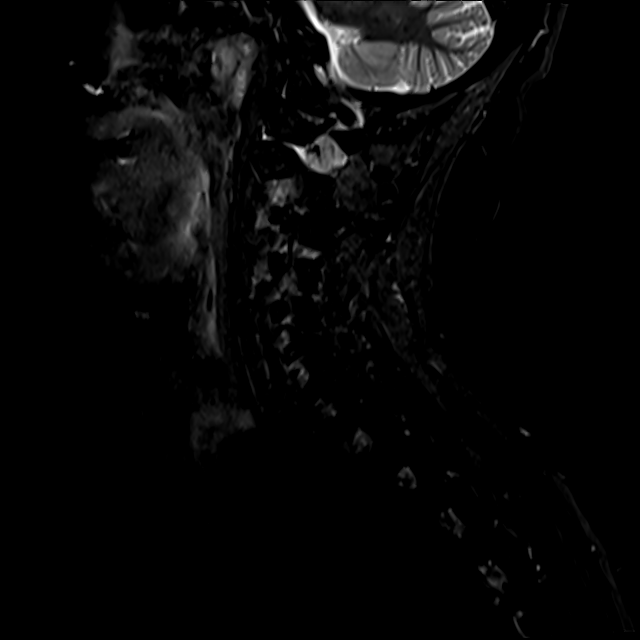

[Series 12: T2 · axial · 3.0mm · 0.62mm/px · z∈[-78,+23]mm · 8 of 32 slices shown (2 of 2)]
[im 1/32]
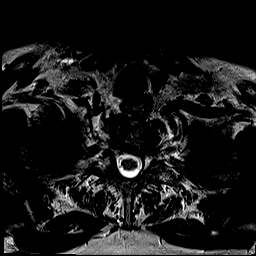
[im 5/32]
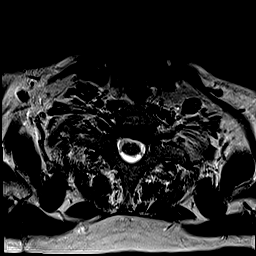
[im 10/32]
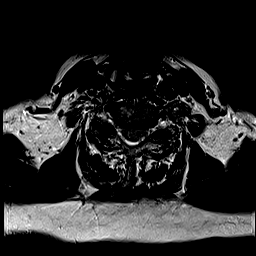
[im 15/32]
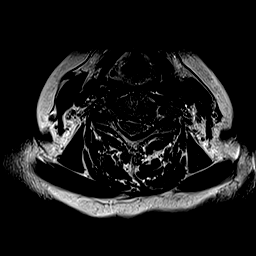
[im 17/32]
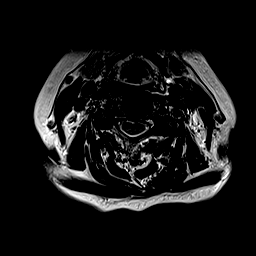
[im 22/32]
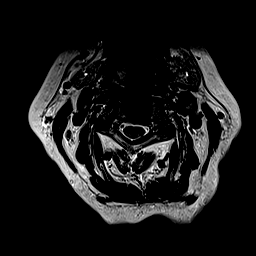
[im 27/32]
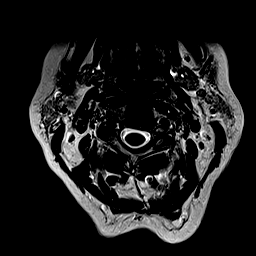
[im 32/32]
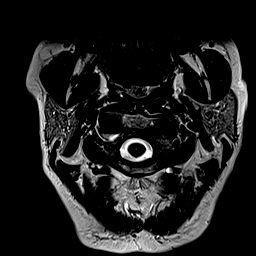

[27 of 48 positions shown; findings below may reference images not displayed]

FINDINGS: Alignment: Physiologic with preservation of the normal cervical
lordosis. No listhesis.

Vertebrae: Vertebral body height well maintained without acute or
chronic fracture. Bone marrow signal intensity within normal limits.
No worrisome osseous lesions. Mild discogenic reactive endplate
change noted about the C6-7 interspace. No abnormal marrow edema.

Cord: Normal signal morphology.

Posterior Fossa, vertebral arteries, paraspinal tissues: Visualized
brain and posterior fossa within normal limits. Craniocervical
junction normal. Paraspinous soft tissues within normal limits.
Normal flow voids seen within the vertebral arteries bilaterally.
Left lobe of thyroid appears to be absent. 9 mm right thyroid
nodule, of significance given size and patient age, no follow-up
imaging recommended (ref: [HOSPITAL]. [DATE]): 143-50).

Disc levels:

C2-C3: Negative interspace.  No canal or foraminal stenosis.

C3-C4: Mild disc bulge with endplate and uncovertebral spurring.
Superimposed tiny central disc protrusion minimally indents the
ventral thecal sac (series 13, image 10). Mild bilateral facet
hypertrophy. No significant spinal stenosis. Foramina remain patent.

C4-C5: Mild degenerative intervertebral disc space narrowing with
diffuse disc bulge and bilateral uncovertebral spurring.
Superimposed central disc protrusion indents the ventral thecal sac
(series 13, image 15). Mild spinal stenosis with mild flattening of
the ventral cord, but no cord signal changes. Mild to moderate
bilateral C5 foraminal stenosis.

C5-C6: Broad-based posterior disc osteophyte flattens and partially
faces the ventral thecal sac, slightly asymmetric to the left.
Superimposed facet and ligament flavum hypertrophy. Resultant mild
spinal stenosis. Moderate left worse than right C6 foraminal
narrowing.

C6-C7: Degenerative intervertebral disc space narrowing with diffuse
disc osteophyte complex. Broad posterior component flattens and
partially effaces the ventral thecal sac, worse on the left. Mild
flattening of the left ventral cord without cord signal changes.
Mild spinal stenosis. Mild to moderate bilateral C7 foraminal
narrowing.

C7-T1: Negative interspace. Mild right-sided facet hypertrophy. No
spinal stenosis. Foramina remain patent.

Visualized upper thoracic spine demonstrates no significant finding.
IMPRESSION: 1. Multilevel cervical spondylosis with resultant mild spinal
stenosis at C4-5 through C6-7.
2. Multifactorial degenerative changes with resultant mild to
moderate bilateral C5 through C7 foraminal stenosis as detailed
above.

## 2022-04-02 ENCOUNTER — Emergency Department (HOSPITAL_BASED_OUTPATIENT_CLINIC_OR_DEPARTMENT_OTHER): Payer: Medicare Other | Admitting: Radiology

## 2022-04-02 ENCOUNTER — Encounter (HOSPITAL_BASED_OUTPATIENT_CLINIC_OR_DEPARTMENT_OTHER): Payer: Self-pay | Admitting: Emergency Medicine

## 2022-04-02 DIAGNOSIS — Z5321 Procedure and treatment not carried out due to patient leaving prior to being seen by health care provider: Secondary | ICD-10-CM | POA: Diagnosis not present

## 2022-04-02 DIAGNOSIS — R1110 Vomiting, unspecified: Secondary | ICD-10-CM | POA: Diagnosis present

## 2022-04-02 DIAGNOSIS — G4489 Other headache syndrome: Secondary | ICD-10-CM | POA: Diagnosis not present

## 2022-04-02 DIAGNOSIS — J101 Influenza due to other identified influenza virus with other respiratory manifestations: Secondary | ICD-10-CM | POA: Diagnosis not present

## 2022-04-02 DIAGNOSIS — Z20822 Contact with and (suspected) exposure to covid-19: Secondary | ICD-10-CM | POA: Diagnosis not present

## 2022-04-02 DIAGNOSIS — R509 Fever, unspecified: Secondary | ICD-10-CM | POA: Diagnosis not present

## 2022-04-02 DIAGNOSIS — R070 Pain in throat: Secondary | ICD-10-CM | POA: Diagnosis not present

## 2022-04-02 DIAGNOSIS — R140 Abdominal distension (gaseous): Secondary | ICD-10-CM | POA: Diagnosis not present

## 2022-04-02 DIAGNOSIS — R42 Dizziness and giddiness: Secondary | ICD-10-CM | POA: Diagnosis not present

## 2022-04-02 DIAGNOSIS — R059 Cough, unspecified: Secondary | ICD-10-CM | POA: Diagnosis not present

## 2022-04-02 LAB — RESP PANEL BY RT-PCR (RSV, FLU A&B, COVID)  RVPGX2
Influenza A by PCR: POSITIVE — AB
Influenza B by PCR: NEGATIVE
Resp Syncytial Virus by PCR: NEGATIVE
SARS Coronavirus 2 by RT PCR: NEGATIVE

## 2022-04-02 NOTE — ED Triage Notes (Signed)
Pt BIB [facility] from home, c/o vertigo, L ear pain, emesis x 1 episode, home temp 100.9, mouth ulcer, generalized fatigue, swollen lymph nodes, v/s en route: 108/60, HR 72, RR16, 96%, CBG 143, pt reports taking Tylenol pta but emesis after taking, home Covid test negative

## 2022-04-03 ENCOUNTER — Emergency Department (HOSPITAL_BASED_OUTPATIENT_CLINIC_OR_DEPARTMENT_OTHER)
Admission: EM | Admit: 2022-04-03 | Discharge: 2022-04-03 | Discharge status: Against Medical Advice | Payer: Medicare Other | Attending: Emergency Medicine | Admitting: Emergency Medicine

## 2022-05-01 ENCOUNTER — Other Ambulatory Visit: Payer: Self-pay | Admitting: Neurology

## 2022-05-05 ENCOUNTER — Telehealth: Payer: Self-pay | Admitting: Neurology

## 2022-05-05 NOTE — Telephone Encounter (Signed)
Called pt. Left a vm message per her DPR that enough medication was sent to [facility] until her appointment.

## 2022-05-05 NOTE — Telephone Encounter (Signed)
We received a request yesterday for refill and it was filled with enough to get her to appointment and should be ready for her to pick up. Please let her know. thanks

## 2022-05-05 NOTE — Telephone Encounter (Signed)
Called pt. Informed her she needed an appointment before refill could be filled. Pt is requesting enough gabapentin (NEURONTIN) 100 MG capsule  until her appointment with Sarah on 2/6 @ 8:15 am. Refill should be sent to [facility]

## 2022-05-17 ENCOUNTER — Ambulatory Visit: Payer: Medicare Other | Admitting: Neurology

## 2022-05-17 ENCOUNTER — Encounter: Payer: Self-pay | Admitting: Neurology

## 2022-05-17 VITALS — BP 143/61 | HR 59 | Ht 63.0 in | Wt 156.0 lb

## 2022-05-17 DIAGNOSIS — R202 Paresthesia of skin: Secondary | ICD-10-CM | POA: Diagnosis not present

## 2022-05-17 DIAGNOSIS — M5416 Radiculopathy, lumbar region: Secondary | ICD-10-CM

## 2022-05-17 MED ORDER — GABAPENTIN 100 MG PO CAPS
ORAL_CAPSULE | ORAL | 3 refills | Status: DC
Start: 2022-05-17 — End: 2023-02-28

## 2022-05-17 MED ORDER — DULOXETINE HCL 60 MG PO CPEP: 60.0000 mg | ORAL_CAPSULE | Freq: Every day | ORAL | 3 refills | Status: DC

## 2022-05-17 NOTE — Progress Notes (Signed)
Chief Complaint  Patient presents with   Follow-up    Rm 15, alone    ASSESSMENT AND PLAN  Lori Webb is a 82 y.o. female   1.  Left lumbar radiculopathy likely left L5, resolved postsurgery August 2023 2.  Paresthesia, achiness bilateral lower extremities -Left lower extremity pain has resolved, continues with achiness to bilateral extremities despite lumbar surgery, concerning to her -Check EMG nerve conduction with Dr. 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 Laboratory evaluations in September 2022, normal C-reactive protein, cholesterol 219, normal TSH, ESR of 6, CMP creatinine of 0.9, CBC hemoglobin of 15.1, B12 of 482  MRI cervical in Nov 2022 1. Multilevel cervical spondylosis with resultant mild spinal stenosis at C4-5 through C6-7. 2. Multifactorial degenerative changes with resultant mild to moderate bilateral C5 through C7 foraminal stenosis as detailed above MEDICAL HISTORY:  Lori Webb is a 82 year old female, seen in request by her primary care physician Dr. Stephanie Acre, Ivin Booty, for evaluation of bilateral lower extremity paresthesia, frequent leg muscle cramping, initial evaluation was on March 08, 2021.  I reviewed and summarized the referring note.  Past medical history Hypothyroidism, on supplement,  Patient still work at [employer], also drives to visit her husband almost daily basis, very active, without any difficulty, but complains of difficulty sleeping, tired,  In October 2022, she had a sudden onset of neck pain, radiating pain to left upper extremity, lasting for 2 weeks, I personally reviewed MRI of cervical spine, multilevel degenerative changes, no significant canal  stenosis, mild to moderate bilateral C5-6 foraminal narrowing, left cervical radicular pain was eventually with the treatment at her primary care's office, I assume it was a Toradol injection  She did have a history of intermittent low back pain, over the summer 2022, she was wearing thin soled sandals, also walk long distance, mildly dehydrated, during that period of time, she experienced frequent neck muscle cramping, more to left side, sometimes woke her up in the middle of the sleep, significant pain, she has to get up pacing around, at its peak it happened couple times each week  Now she worries more cushioned shoes, keep better hydration, her symptoms has much improved, she denies significant pain, denies bowel and bladder incontinence, mild intermittent bilateral plantar surface especially ball of her feet paresthesia,  UPDATE May 13 2021: Following last visit in November 2022, she had a flareup of her worsening left-sided low back pain, radiating pain to left hip, left lower extremity, was seen by [facility] orthopedic surgeon, ordered MRI of left hip, lumbar spine, was done at [facility] reviewed MRI of lumbar March 26, 2021, multilevel degenerative changes, most severe at L4-5, facet arthropathy, disc bulging with a left far lateral foraminal disc extrusion causing severe neuroforaminal narrowing, with impingement upon the exiting nerve roots, moderate central canal stenosis  MRI of left hip showed mild osteoarthritis, left-sided labral tear,  She received first left lower lumbar epidural injection in January, with temporary relief, now with flareup of left-sided low back pain, radiating pain to left lateral leg, left first toe  Gait limitations due to pain, denies bowel and bladder incontinence, has following up with pain management again  Update May 17, 2022 SS: Dr. Reatha Armour  did lumbar surgery  in August, it went well. Radiating pain down left leg resolved. Still has  achy feeling in legs. Takes Cymbalta 60 mg in the morning, gabapentin 100 mg, 1 in the morning, 1 midday, 3 at bedtime. Her husband is in SNF, he he has dementia and PD. She still works in [employer]. After surgery back pain is gone, but most concern is continued achy feeling in feet and legs, worse in the morning, doesn't feel off balance, but doesn't feel as steady. Denies any DM  PHYSICAL EXAM:   Vitals:   05/17/22 0812  BP: (!) 143/61  Pulse: (!) 59  Weight: 156 lb (70.8 kg)  Height: '5\' 3"'$  (1.6 m)    Not recorded     Body mass index is 27.63 kg/m.  PHYSICAL EXAMNIATION:  Gen: NAD, conversant, well nourised, well groomed                     Cardiovascular: Regular rate rhythm, no peripheral edema, warm, nontender.  NEUROLOGICAL EXAM:  MENTAL STATUS: Speech/cognition: Awake, alert, oriented to history taking and casual conversation   CRANIAL NERVES: CN II: Visual fields are full to confrontation. Pupils are round equal and briskly reactive to light. CN III, IV, VI: extraocular movement are normal. No ptosis. CN V: Facial sensation is intact to light touch CN VII: Face is symmetric with normal eye closure  CN VIII: Hearing is normal to causal conversation. CN IX, X: Phonation is normal. CN XI: Head turning and shoulder shrug are intact  MOTOR: There is no pronator drift of out-stretched arms. Muscle bulk and tone are normal. Muscle strength is normal.  REFLEXES: Reflexes are 2+ and symmetric at the biceps, triceps, knees  SENSORY: Decreased pinprick sensation to midfoot, vibration and soft touch sensation normal  COORDINATION: Finger-nose-finger and heel-to-shin are normal bilaterally  GAIT/STANCE:  gait is steady with normal steps, base, arm swing, and turning. Heel and toe walking are normal. Tandem gait is normal.  Romberg is absent.  REVIEW OF SYSTEMS:  Full 14 system review of systems performed and notable only for as above All other review of systems were  negative.   ALLERGIES: Allergies  Allergen Reactions   Crestor [Rosuvastatin]     Muscle pain    HOME MEDICATIONS: Current Outpatient Medications  Medication Sig Dispense Refill   acetaminophen (TYLENOL) 650 MG CR tablet Take 650 mg by mouth every 8 (eight) hours as needed for pain.     levothyroxine (SYNTHROID, LEVOTHROID) 100 MCG tablet Take 100 mcg by mouth daily before breakfast.      traMADol (ULTRAM) 50 MG tablet Take 1 tablet (50 mg total) by mouth every 6 (six) hours as needed for moderate pain or severe pain. 20 tablet 0   DULoxetine (CYMBALTA) 60 MG capsule Take 1 capsule (60 mg total) by mouth daily. Fill her Cymbalta '30mg'$  daily first. 90 capsule 3   gabapentin (NEURONTIN) 100 MG capsule Take 1 capsule twice daily, take 3 at night 450 capsule 3   No current facility-administered medications for this visit.    PAST MEDICAL HISTORY: Past Medical History:  Diagnosis Date   Anxiety    Arthritis    hands   Cholelithiasis with obstruction 12/12/2012   Diverticulosis    Hx of diverticulitis of colon 12/12/2012   11/21/12 first diagnosis of this.   Hypothyroid 12/12/2012   S/p thyroidectomy for Goiter.    PAST SURGICAL HISTORY: Past Surgical History:  Procedure Laterality Date   ABDOMINAL  HYSTERECTOMY     APPENDECTOMY     CESAREAN SECTION     x2   CHOLECYSTECTOMY N/A 12/14/2012   Procedure: LAPAROSCOPIC CHOLECYSTECTOMY WITH INTRAOPERATIVE CHOLANGIOGRAM;  Surgeon: Odis Hollingshead, MD;  Location: [facility];  Service: General;  Laterality: N/A;   COLONOSCOPY     CYST EXCISION N/A 10/14/2021   Procedure: EXCISION RIGHT UPPER BACK CYST;  Surgeon: Coralie Keens, MD;  Location: [facility];  Service: General;  Laterality: N/A;   DILATION AND CURETTAGE OF UTERUS     ERCP N/A 12/13/2012   Procedure: ENDOSCOPIC RETROGRADE CHOLANGIOPANCREATOGRAPHY (ERCP);  Surgeon: Gatha Mayer, MD;  Location: [facility];  Service: Endoscopy;  Laterality: N/A;   ERCP N/A 12/16/2012    Procedure: ENDOSCOPIC RETROGRADE CHOLANGIOPANCREATOGRAPHY (ERCP);  Surgeon: Gatha Mayer, MD;  Location: [facility];  Service: Gastroenterology;  Laterality: N/A;   FRACTURE SURGERY     nasal fx  x3   65 years ago   MASS EXCISION  05/02/2012   Procedure: EXCISION MASS;  Surgeon: Harl Bowie, MD;  Location: [facility];  Service: General;  Laterality: Right;  excision cyst right upper back   PLANTAR FASCIA RELEASE  04/12/2007   left heel   THYROIDECTOMY     TONSILLECTOMY      FAMILY HISTORY: Family History  Problem Relation Age of Onset   Cancer Mother        breast   Cancer Maternal Grandmother        breast    SOCIAL HISTORY: Social History   Socioeconomic History   Marital status: Married    Spouse name: Not on file   Number of children: Not on file   Years of education: Not on file   Highest education level: Not on file  Occupational History   Not on file  Tobacco Use   Smoking status: Never   Smokeless tobacco: Never  Vaping Use   Vaping Use: Never used  Substance and Sexual Activity   Alcohol use: Yes    Alcohol/week: 1.0 standard drink of alcohol    Types: 1 Glasses of wine per week    Comment: 1 glass of wine per night   Drug use: No   Sexual activity: Never  Other Topics Concern   Not on file  Social History Narrative   Not on file   Social Determinants of Health   Financial Resource Strain: Not on file  Food Insecurity: Not on file  Transportation Needs: Not on file  Physical Activity: Not on file  Stress: Not on file  Social Connections: Not on file  Intimate Partner Violence: Not on file   Butler Denmark, Laqueta Jean, [facility] [address], [place], [place] [phone] [phone]

## 2022-05-17 NOTE — Patient Instructions (Signed)
Order Nerve conduction study  Continue with gabapentin and Cymbalta

## 2022-05-26 ENCOUNTER — Other Ambulatory Visit: Payer: Self-pay | Admitting: Neurology

## 2022-05-31 DIAGNOSIS — I700 Atherosclerosis of aorta: Secondary | ICD-10-CM | POA: Diagnosis not present

## 2022-05-31 DIAGNOSIS — R1031 Right lower quadrant pain: Secondary | ICD-10-CM | POA: Diagnosis not present

## 2022-06-06 DIAGNOSIS — M5116 Intervertebral disc disorders with radiculopathy, lumbar region: Secondary | ICD-10-CM | POA: Diagnosis not present

## 2022-06-06 DIAGNOSIS — Z6827 Body mass index (BMI) 27.0-27.9, adult: Secondary | ICD-10-CM | POA: Diagnosis not present

## 2022-06-06 NOTE — Progress Notes (Signed)
Chart reviewed, agree above plan ?

## 2022-06-20 DIAGNOSIS — K080 Exfoliation of teeth due to systemic causes: Secondary | ICD-10-CM | POA: Diagnosis not present

## 2022-07-20 DIAGNOSIS — K080 Exfoliation of teeth due to systemic causes: Secondary | ICD-10-CM | POA: Diagnosis not present

## 2022-07-21 DIAGNOSIS — K080 Exfoliation of teeth due to systemic causes: Secondary | ICD-10-CM | POA: Diagnosis not present

## 2022-08-01 DIAGNOSIS — H524 Presbyopia: Secondary | ICD-10-CM | POA: Diagnosis not present

## 2022-08-19 ENCOUNTER — Ambulatory Visit: Payer: Medicare Other | Admitting: Neurology

## 2022-08-19 VITALS — BP 135/59 | HR 61

## 2022-08-19 DIAGNOSIS — R252 Cramp and spasm: Secondary | ICD-10-CM | POA: Diagnosis not present

## 2022-08-19 DIAGNOSIS — R52 Pain, unspecified: Secondary | ICD-10-CM

## 2022-08-19 DIAGNOSIS — M542 Cervicalgia: Secondary | ICD-10-CM

## 2022-08-19 DIAGNOSIS — R202 Paresthesia of skin: Secondary | ICD-10-CM | POA: Diagnosis not present

## 2022-08-19 MED ORDER — DULOXETINE HCL 30 MG PO CPEP: 30.0000 mg | ORAL_CAPSULE | Freq: Every day | ORAL | 11 refills | Status: DC

## 2022-08-19 NOTE — Procedures (Signed)
Full Name: Lori Webb Gender: Female MRN #: [id number] Date of Birth: 1941/01/08    Visit Date: 08/19/2022 09:37 Age: 82 Years Examining Physician: Levert Feinstein Referring Physician: Levert Feinstein  Height: 5 feet 4 inch History: 82 year old female complains of frequent lower extremity muscle spasm, paresthesia  Summary of the test: Nerve conduction study: Bilateral sural, superficial peroneal sensory distal normal.  Bilateral peroneal to EDB and tibial motor responses were normal.  Right median sensory and motor responses were normal.  Right ulnar sensory responses were normal  Electromyography: Selective needle examinations of bilateral lower extremity muscles and bilateral lumbosacral paraspinal muscles were normal.  Conclusion: This is a normal study.  There is no electrodiagnostic evidence of large fiber peripheral neuropathy or active lumbosacral radiculopathy.    ------------------------------- Levert Feinstein, M.D.Ph.D.  [facility] [address], [address] [place], [place] [address] Tel: [phone] Fax: [phone]  Verbal informed consent was obtained from the patient, patient was informed of potential risk of procedure, including bruising, bleeding, hematoma formation, infection, muscle weakness, muscle pain, numbness, among others.        MNC    Nerve / Sites Muscle Latency Ref. Amplitude Ref. Rel Amp Segments Distance Velocity Ref. Area    ms ms mV mV %  cm m/s m/s mVms  R Median - APB     Wrist APB 2.9 ?4.4 5.5 ?4.0 100 Wrist - APB 7   19.6     Upper arm APB 7.0  5.5  98.8 Upper arm - Wrist 21 51 ?49 19.6  R Peroneal - EDB     Ankle EDB 3.9 ?6.5 3.6 ?2.0 100 Ankle - EDB 9   12.6     Fib head EDB 9.9  4.6  127 Fib head - Ankle 27 45 ?44 15.8     Pop fossa EDB 12.3  4.3  94.1 Pop fossa - Fib head 11 46 ?44 16.5         Pop fossa - Ankle      L Peroneal - EDB     Ankle EDB 4.0 ?6.5 2.7 ?2.0 100 Ankle - EDB 9   10.3     Fib head EDB 10.1   2.6  94.4 Fib head - Ankle 27 44 ?44 10.5     Pop fossa EDB 12.9  2.7  103 Pop fossa - Fib head 13 45 ?44 10.2         Pop fossa - Ankle      R Tibial - AH     Ankle AH 4.0 ?5.8 9.2 ?4.0 100 Ankle - AH 9   17.0     Pop fossa AH 11.8  7.2  77.9 Pop fossa - Ankle   ?41 15.9  L Tibial - AH     Ankle AH 4.3 ?5.8 7.2 ?4.0 100 Ankle - AH 9   15.1     Pop fossa AH 12.6  6.0  82.9 Pop fossa - Ankle 40 48 ?41 17.4               SNC    Nerve / Sites Rec. Site Peak Lat Ref.  Amp Ref. Segments Distance    ms ms V V  cm  R Sural - Ankle (Calf)     Calf Ankle 4.1 ?4.4 15 ?6 Calf - Ankle 14  L Sural - Ankle (Calf)     Calf Ankle 3.7 ?4.4 8 ?6 Calf - Ankle 14  R Superficial  peroneal - Ankle     Lat leg Ankle 3.8 ?4.4 9 ?6 Lat leg - Ankle 14  L Superficial peroneal - Ankle     Lat leg Ankle 4.0 ?4.4 7 ?6 Lat leg - Ankle 14  R Median - Orthodromic (Dig II, Mid palm)     Dig II Wrist 2.8 ?3.4 11 ?10 Dig II - Wrist 13  R Ulnar - Orthodromic, (Dig V, Mid palm)     Dig V Wrist 2.6 ?3.1 11 ?5 Dig V - Wrist 51                 F  Wave    Nerve F Lat Ref.   ms ms  R Tibial - AH 52.2 ?56.0  L Peroneal - EDB  ?56.0  L Tibial - AH 34.6 ?56.0  L Tibial - AH 55.2 ?56.0             EMG Summary Table    Spontaneous MUAP Recruitment  Muscle IA Fib PSW Fasc Other Amp Dur. Poly Pattern  R. Tibialis anterior Normal None None None _______ Normal Normal Normal Normal  R. Peroneus longus Normal None None None _______ Normal Normal Normal Normal  R. Tibialis posterior Normal None None None _______ Normal Normal Normal Normal  R. Vastus lateralis Normal None None None _______ Normal Normal Normal Normal  R. Gastrocnemius (Medial head) Normal None None None _______ Normal Normal Normal Normal  L. Tibialis anterior Normal None None None _______ Normal Normal Normal Normal  L. Tibialis posterior Normal None None None _______ Normal Normal Normal Normal  L. Peroneus longus Normal None None None _______ Normal  Normal Normal Normal  L. Gastrocnemius (Medial head) Normal None None None _______ Normal Normal Normal Normal  L. Vastus lateralis Normal None None None _______ Normal Normal Normal Normal  R. Lumbar paraspinals (low) Normal None None None _______ Normal Normal Normal Normal  R. Lumbar paraspinals (mid) Normal None None None _______ Normal Normal Normal Normal  L. Lumbar paraspinals (low) Normal None None None _______ Normal Normal Normal Normal  L. Lumbar paraspinals (mid) Normal None None None _______ Normal Normal Normal Normal

## 2022-08-19 NOTE — Progress Notes (Signed)
Chief Complaint  Patient presents with   Procedure    RM EMG4.    ASSESSMENT AND PLAN  Lori Webb is a 82 y.o. female   Left lumbar radiculopathy likely left L5, resolved postsurgery August 2023 Bilateral lower extremity paresthesia, chronic neck pain, frequent muscle cramping,  Normal EMG nerve conduction study  Continue Cymbalta 60 mg, gabapentin  Brisk reflex on examination, worsening urinary urgency, MRI of cervical spine to rule out cervical spondylitic myelopathy  Laboratory evaluation to rule out inflammatory markers  Return To Clinic With NP In 6 Months   DIAGNOSTIC DATA (LABS, IMAGING, TESTING) - I reviewed patient records, labs, notes, testing and imaging myself where available. Laboratory evaluations in September 2022, normal C-reactive protein, cholesterol 219, normal TSH, ESR of 6, CMP creatinine of 0.9, CBC hemoglobin of 15.1, B12 of 482  MRI cervical in Nov 2022 1. Multilevel cervical spondylosis with resultant mild spinal stenosis at C4-5 through C6-7. 2. Multifactorial degenerative changes with resultant mild to moderate bilateral C5 through C7 foraminal stenosis as detailed above MEDICAL HISTORY:  AMYLIA Webb is a 82 year old female, seen in request by her primary care physician Dr. Paulino Rily, [name], for evaluation of bilateral lower extremity paresthesia, frequent leg muscle cramping, initial evaluation was on March 08, 2021.  I reviewed and summarized the referring note.  Past medical history Hypothyroidism, on supplement,  Patient still work at [employer], also drives to visit her husband almost daily basis, very active, without any difficulty, but complains of difficulty sleeping, tired,  In October 2022, she had a sudden onset of neck pain, radiating pain to left upper extremity, lasting for 2 weeks, I personally reviewed MRI of cervical spine, multilevel degenerative changes, no significant canal stenosis, mild to moderate  bilateral C5-6 foraminal narrowing, left cervical radicular pain was eventually with the treatment at her primary care's office, I assume it was a Toradol injection  She did have a history of intermittent low back pain, over the summer 2022, she was wearing thin soled sandals, also walk long distance, mildly dehydrated, during that period of time, she experienced frequent neck muscle cramping, more to left side, sometimes woke her up in the middle of the sleep, significant pain, she has to get up pacing around, at its peak it happened couple times each week  Now she worries more cushioned shoes, keep better hydration, her symptoms has much improved, she denies significant pain, denies bowel and bladder incontinence, mild intermittent bilateral plantar surface especially ball of her feet paresthesia,  UPDATE May 13 2021: Following last visit in November 2022, she had a flareup of her worsening left-sided low back pain, radiating pain to left hip, left lower extremity, was seen by [facility] orthopedic surgeon, ordered MRI of left hip, lumbar spine, was done at [facility]  Personally reviewed MRI of lumbar March 26, 2021, multilevel degenerative changes, most severe at L4-5, facet arthropathy, disc bulging with a left far lateral foraminal disc extrusion causing severe neuroforaminal narrowing, with impingement upon the exiting nerve roots, moderate central canal stenosis  MRI of left hip showed mild osteoarthritis, left-sided labral tear,  She received first left lower lumbar epidural injection in January, with temporary relief, now with flareup of left-sided low back pain, radiating pain to left lateral leg, left first toe  Gait limitations due to pain, denies bowel and bladder incontinence, has following up with pain management again  UPDATE Aug 19 2022: Back pain has much improved after lumbar decompression surgery in  August 2024, continue to feel diffuse body achy pain, frequent muscle  cramping despite gabapentin 100 mg 1/1/3 , Cymbalta 60 mg daily  She continues to attend her husband twice every day, work part-time job, had chronic neck pain, improved with physical therapy, worsening urinary urgency, denied gait abnormalities, also complains of hand muscle cramping sometimes  EMG nerve conduction study today is normal, no evidence of large fiber peripheral neuropathy,  PHYSICAL EXAM:   Vitals:   08/19/22 0923  BP: (!) 135/59  Pulse: 61   PHYSICAL EXAMNIATION:  Gen: NAD, conversant, well nourised, well groomed                     Cardiovascular: Regular rate rhythm, no peripheral edema, warm, nontender.  NEUROLOGICAL EXAM:  MENTAL STATUS: Speech/cognition: Awake, alert, oriented to history taking and casual conversation   CRANIAL NERVES: CN II: Visual fields are full to confrontation. Pupils are round equal and briskly reactive to light. CN III, IV, VI: extraocular movement are normal. No ptosis. CN V: Facial sensation is intact to light touch CN VII: Face is symmetric with normal eye closure  CN VIII: Hearing is normal to causal conversation. CN IX, X: Phonation is normal. CN XI: Head turning and shoulder shrug are intact  MOTOR: There is no pronator drift of out-stretched arms. Muscle bulk and tone are normal. Muscle strength is normal.  REFLEXES: Reflexes are 2+ and symmetric at the biceps, triceps, knees, bilateral Hoffmann signs, plantar responses were extensor bilaterally  SENSORY: Decreased pinprick sensation to midfoot, vibration and soft touch sensation normal  COORDINATION: Finger-nose-finger and heel-to-shin are normal bilaterally  GAIT/STANCE:  gait is steady with normal steps, base, arm swing, and turning.   REVIEW OF SYSTEMS:  Full 14 system review of systems performed and notable only for as above All other review of systems were negative.   ALLERGIES: Allergies  Allergen Reactions   Crestor [Rosuvastatin]     Muscle pain     HOME MEDICATIONS: Current Outpatient Medications  Medication Sig Dispense Refill   acetaminophen (TYLENOL) 650 MG CR tablet Take 650 mg by mouth every 8 (eight) hours as needed for pain.     DULoxetine (CYMBALTA) 60 MG capsule Take 1 capsule (60 mg total) by mouth daily. Fill her Cymbalta 30mg  daily first. 90 capsule 3   gabapentin (NEURONTIN) 100 MG capsule Take 1 capsule twice daily, take 3 at night 450 capsule 3   levothyroxine (SYNTHROID, LEVOTHROID) 100 MCG tablet Take 100 mcg by mouth daily before breakfast.      traMADol (ULTRAM) 50 MG tablet Take 1 tablet (50 mg total) by mouth every 6 (six) hours as needed for moderate pain or severe pain. 20 tablet 0   No current facility-administered medications for this visit.    PAST MEDICAL HISTORY: Past Medical History:  Diagnosis Date   Anxiety    Arthritis    hands   Cholelithiasis with obstruction 12/12/2012   Diverticulosis    Hx of diverticulitis of colon 12/12/2012   11/21/12 first diagnosis of this.   Hypothyroid 12/12/2012   S/p thyroidectomy for Goiter.    PAST SURGICAL HISTORY: Past Surgical History:  Procedure Laterality Date   ABDOMINAL HYSTERECTOMY     APPENDECTOMY     CESAREAN SECTION     x2   CHOLECYSTECTOMY N/A 12/14/2012   Procedure: LAPAROSCOPIC CHOLECYSTECTOMY WITH INTRAOPERATIVE CHOLANGIOGRAM;  Surgeon: Adolph Pollack, MD;  Location: [facility];  Service: General;  Laterality: N/A;   COLONOSCOPY  CYST EXCISION N/A 10/14/2021   Procedure: EXCISION RIGHT UPPER BACK CYST;  Surgeon: Abigail Miyamoto, MD;  Location: [facility];  Service: General;  Laterality: N/A;   DILATION AND CURETTAGE OF UTERUS     ERCP N/A 12/13/2012   Procedure: ENDOSCOPIC RETROGRADE CHOLANGIOPANCREATOGRAPHY (ERCP);  Surgeon: Iva Boop, MD;  Location: [facility];  Service: Endoscopy;  Laterality: N/A;   ERCP N/A 12/16/2012   Procedure: ENDOSCOPIC RETROGRADE CHOLANGIOPANCREATOGRAPHY (ERCP);  Surgeon: Iva Boop, MD;  Location:  [facility];  Service: Gastroenterology;  Laterality: N/A;   FRACTURE SURGERY     nasal fx  x3   65 years ago   MASS EXCISION  05/02/2012   Procedure: EXCISION MASS;  Surgeon: Shelly Rubenstein, MD;  Location: [facility];  Service: General;  Laterality: Right;  excision cyst right upper back   PLANTAR FASCIA RELEASE  04/12/2007   left heel   THYROIDECTOMY     TONSILLECTOMY      FAMILY HISTORY: Family History  Problem Relation Age of Onset   Cancer Mother        breast   Cancer Maternal Grandmother        breast    SOCIAL HISTORY: Social History   Socioeconomic History   Marital status: Married    Spouse name: Not on file   Number of children: Not on file   Years of education: Not on file   Highest education level: Not on file  Occupational History   Not on file  Tobacco Use   Smoking status: Never   Smokeless tobacco: Never  Vaping Use   Vaping Use: Never used  Substance and Sexual Activity   Alcohol use: Yes    Alcohol/week: 1.0 standard drink of alcohol    Types: 1 Glasses of wine per week    Comment: 1 glass of wine per night   Drug use: No   Sexual activity: Never  Other Topics Concern   Not on file  Social History Narrative   Not on file   Social Determinants of Health   Financial Resource Strain: Not on file  Food Insecurity: Not on file  Transportation Needs: Not on file  Physical Activity: Not on file  Stress: Not on file  Social Connections: Not on file  Intimate Partner Violence: Not on file   Margie Ege, Edrick Oh, DNP  [facility] [address] [place], [place] [address] [phone]

## 2022-08-20 LAB — SEDIMENTATION RATE: Sed Rate: 2 mm/hr (ref 0–40)

## 2022-08-20 LAB — VITAMIN D 25 HYDROXY (VIT D DEFICIENCY, FRACTURES): Vit D, 25-Hydroxy: 22.6 ng/mL — ABNORMAL LOW (ref 30.0–100.0)

## 2022-08-20 LAB — C-REACTIVE PROTEIN: CRP: 2 mg/L (ref 0–10)

## 2022-08-20 LAB — CK: Total CK: 92 U/L (ref 26–161)

## 2022-08-20 LAB — ANA W/REFLEX IF POSITIVE: Anti Nuclear Antibody (ANA): NEGATIVE

## 2022-08-23 ENCOUNTER — Telehealth: Payer: Self-pay | Admitting: Neurology

## 2022-08-23 NOTE — Telephone Encounter (Signed)
[employer] medicare Berkley Harvey: [id number] exp. 08/22/22-09/20/22 sent to [facility] for an open MRI. [phone]

## 2022-08-25 DIAGNOSIS — M50321 Other cervical disc degeneration at C4-C5 level: Secondary | ICD-10-CM | POA: Diagnosis not present

## 2022-08-25 DIAGNOSIS — M47812 Spondylosis without myelopathy or radiculopathy, cervical region: Secondary | ICD-10-CM | POA: Diagnosis not present

## 2022-08-25 DIAGNOSIS — M2578 Osteophyte, vertebrae: Secondary | ICD-10-CM | POA: Diagnosis not present

## 2022-08-25 DIAGNOSIS — M50322 Other cervical disc degeneration at C5-C6 level: Secondary | ICD-10-CM | POA: Diagnosis not present

## 2022-09-23 ENCOUNTER — Encounter: Payer: Self-pay | Admitting: Neurology

## 2022-09-23 NOTE — Progress Notes (Signed)
Patient had Cervical spine MRI completed on 08/25/2022 1.  Multilevel DDD and facet arthrosis. No focal disc herniation appreciated. Broad-based posterior disc osteophyte complexes and thickening of the ligamentum flavum contribute to moderate spinal canal stenosis and mild cord impingement/compression at C4-5 through C6-7. Bilateral foraminal narrowing also suggested at these levels, most prominent at C5-6. Correlate with radicular symptoms.  2.  No acute bony abnormality.  3.  No convincing intrinsic spinal cord abnormality.   Please call and inform patient that MRI cervical spine evidence of arthritis causing minima tightening around the spinal cord. The nerve conduction study was normal, but we will defer to Dr. Terrace Arabia is she want to any additional work up.   Dr. Teresa Coombs

## 2022-09-26 ENCOUNTER — Telehealth: Payer: Self-pay

## 2022-09-26 NOTE — Telephone Encounter (Signed)
Called and spoke to pt and relayed results. Pt voiced gratitude and understanding.

## 2022-09-26 NOTE — Telephone Encounter (Signed)
Called and informed pt of result. Pt voiced understanding and gratitude

## 2022-09-26 NOTE — Telephone Encounter (Signed)
Called and relayed result pt voiced gratitude and understanding.

## 2022-09-30 DIAGNOSIS — L700 Acne vulgaris: Secondary | ICD-10-CM | POA: Diagnosis not present

## 2022-11-18 DIAGNOSIS — E7800 Pure hypercholesterolemia, unspecified: Secondary | ICD-10-CM | POA: Diagnosis not present

## 2022-11-18 DIAGNOSIS — Z79899 Other long term (current) drug therapy: Secondary | ICD-10-CM | POA: Diagnosis not present

## 2022-11-18 DIAGNOSIS — E039 Hypothyroidism, unspecified: Secondary | ICD-10-CM | POA: Diagnosis not present

## 2022-11-18 DIAGNOSIS — M109 Gout, unspecified: Secondary | ICD-10-CM | POA: Diagnosis not present

## 2022-11-18 DIAGNOSIS — F330 Major depressive disorder, recurrent, mild: Secondary | ICD-10-CM | POA: Diagnosis not present

## 2022-11-18 DIAGNOSIS — Z0000 Encounter for general adult medical examination without abnormal findings: Secondary | ICD-10-CM | POA: Diagnosis not present

## 2022-12-19 DIAGNOSIS — U071 COVID-19: Secondary | ICD-10-CM | POA: Diagnosis not present

## 2022-12-19 DIAGNOSIS — R0981 Nasal congestion: Secondary | ICD-10-CM | POA: Diagnosis not present

## 2022-12-19 DIAGNOSIS — R509 Fever, unspecified: Secondary | ICD-10-CM | POA: Diagnosis not present

## 2022-12-19 DIAGNOSIS — R059 Cough, unspecified: Secondary | ICD-10-CM | POA: Diagnosis not present

## 2023-02-17 DIAGNOSIS — Z1231 Encounter for screening mammogram for malignant neoplasm of breast: Secondary | ICD-10-CM | POA: Diagnosis not present

## 2023-02-27 NOTE — Progress Notes (Unsigned)
Patient: Lori Webb Date of Birth: September 19, 1940  Reason for Visit: Follow up History from: Patient Primary Neurologist: Lori Webb  ASSESSMENT AND PLAN 82 y.o. year old female   1.  Left lumbar radiculopathy likely L5, resolved postsurgery August 2023 2.  Bilateral lower extremity paresthesia 3.  Chronic neck pain 4.  Cervical spine stenosis   -Referral to Dr. Jake Webb for moderate cervical spinal canal stenosis and mild cord impingement/compression at C4-5 and C6-7 -We will continue Cymbalta 60 mg daily, gabapentin 100 mg, up to 5 tablets daily -She remains very active, functional, independent  -Normal EMG nerve conduction May 2024 -CK, CRP, sed rate, ANA were normal, vitamin D 22.6 -MRI cervical spine May 2024 showed moderate spinal canal stenosis and mild cord impingement/compression at C4-5 and C6-7 -Follow up in 6-8 months with me, keep me posted on neurosurgical opinon  HISTORY  Lori Webb is a 82 year old female, seen in request by her primary care physician Dr. Paulino Webb, Lori Webb, for evaluation of bilateral lower extremity paresthesia, frequent leg muscle cramping, initial evaluation was on March 08, 2021.   I reviewed and summarized the referring note.  Past medical history Hypothyroidism, on supplement,   Patient still work at [employer], also drives to visit her husband almost daily basis, very active, without any difficulty, but complains of difficulty sleeping, tired,   In October 2022, she had a sudden onset of neck pain, radiating pain to left upper extremity, lasting for 2 weeks, I personally reviewed MRI of cervical spine, multilevel degenerative changes, no significant canal stenosis, mild to moderate bilateral C5-6 foraminal narrowing, left cervical radicular pain was eventually with the treatment at her primary care's office, I assume it was a Toradol injection  She did have a history of intermittent low back pain, over the summer 2022, she was  wearing thin soled sandals, also walk long distance, mildly dehydrated, during that period of time, she experienced frequent neck muscle cramping, more to left side, sometimes woke her up in the middle of the sleep, significant pain, she has to get up pacing around, at its peak it happened couple times each week  Now she worries more cushioned shoes, keep better hydration, her symptoms has much improved, she denies significant pain, denies bowel and bladder incontinence, mild intermittent bilateral plantar surface especially ball of her feet paresthesia,   UPDATE May 13 2021: Following last visit in November 2022, she had a flareup of her worsening left-sided low back pain, radiating pain to left hip, left lower extremity, was seen by Lori Webb orthopedic surgeon, ordered MRI of left hip, lumbar spine, was done at [facility]  Personally reviewed MRI of lumbar Webb 16, 2022, multilevel degenerative changes, most severe at L4-5, facet arthropathy, disc bulging with a left far lateral foraminal disc extrusion causing severe neuroforaminal narrowing, with impingement upon the exiting nerve roots, moderate Lori canal stenosis  MRI of left hip showed mild osteoarthritis, left-sided labral tear,  She received first left lower lumbar epidural injection in January, with temporary relief, now with flareup of left-sided low back pain, radiating pain to left lateral leg, left first toe  Gait limitations due to pain, denies bowel and bladder incontinence, has following up with pain management again   UPDATE Aug 19 2022: Back pain has much improved after lumbar decompression surgery in August 2024, continue to feel diffuse body achy pain, frequent muscle cramping despite gabapentin 100 mg 1/1/3 , Cymbalta 60 mg daily   She continues to attend her  husband twice every day, work part-time job, had chronic neck pain, improved with physical therapy, worsening urinary urgency, denied gait abnormalities, also  complains of hand muscle cramping sometimes   EMG nerve conduction study today is normal, no evidence of large fiber peripheral neuropathy,  Update February 28, 2023 SS: Her husband passed in October. Working her part time job in [employer]. Occasional shark pain to left side of neck. Balance is ok because acutely aware. Pain to mid legs with touch, her thighs ache, massage feels good. Has slowed her down. Urinary urgency. On Vitamin D supplement. MRI cervical spine May 2024 showed moderate spinal canal stenosis and mild cord impingement/compression at C4-5 and C6-7  REVIEW OF SYSTEMS: Out of a complete 14 system review of symptoms, the patient complains only of the following symptoms, and all other reviewed systems are negative.  See HPI  ALLERGIES: Allergies  Allergen Reactions   Crestor [Rosuvastatin]     Muscle pain    HOME MEDICATIONS: Outpatient Medications Prior to Visit  Medication Sig Dispense Refill   acetaminophen (TYLENOL) 650 MG CR tablet Take 650 mg by mouth every 8 (eight) hours as needed for pain.     CALCIUM PO Take by mouth daily.     Cholecalciferol (VITAMIN D3 PO) Take 1,200 Units by mouth daily.     levothyroxine (SYNTHROID, LEVOTHROID) 100 MCG tablet Take 100 mcg by mouth daily before breakfast.      traMADol (ULTRAM) 50 MG tablet Take 1 tablet (50 mg total) by mouth every 6 (six) hours as needed for moderate pain or severe pain. 20 tablet 0   DULoxetine (CYMBALTA) 60 MG capsule Take 1 capsule (60 mg total) by mouth daily. Fill her Cymbalta 30mg  daily first. 90 capsule 3   gabapentin (NEURONTIN) 100 MG capsule Take 1 capsule twice daily, take 3 at night 450 capsule 3   No facility-administered medications prior to visit.    PAST MEDICAL HISTORY: Past Medical History:  Diagnosis Date   Anxiety    Arthritis    hands   Cholelithiasis with obstruction 12/12/2012   Diverticulosis    Hx of diverticulitis of colon 12/12/2012   11/21/12 first diagnosis of this.    Hypothyroid 12/12/2012   S/p thyroidectomy for Goiter.    PAST SURGICAL HISTORY: Past Surgical History:  Procedure Laterality Date   ABDOMINAL HYSTERECTOMY     APPENDECTOMY     CESAREAN SECTION     x2   CHOLECYSTECTOMY N/A 12/14/2012   Procedure: LAPAROSCOPIC CHOLECYSTECTOMY WITH INTRAOPERATIVE CHOLANGIOGRAM;  Surgeon: Adolph Pollack, MD;  Location: [facility];  Service: General;  Laterality: N/A;   COLONOSCOPY     CYST EXCISION N/A 10/14/2021   Procedure: EXCISION RIGHT UPPER BACK CYST;  Surgeon: Abigail Miyamoto, MD;  Location: [facility];  Service: General;  Laterality: N/A;   DILATION AND CURETTAGE OF UTERUS     ERCP N/A 12/13/2012   Procedure: ENDOSCOPIC RETROGRADE CHOLANGIOPANCREATOGRAPHY (ERCP);  Surgeon: Iva Boop, MD;  Location: [facility];  Service: Endoscopy;  Laterality: N/A;   ERCP N/A 12/16/2012   Procedure: ENDOSCOPIC RETROGRADE CHOLANGIOPANCREATOGRAPHY (ERCP);  Surgeon: Iva Boop, MD;  Location: [facility];  Service: Gastroenterology;  Laterality: N/A;   FRACTURE SURGERY     nasal fx  x3   65 years ago   MASS EXCISION  05/02/2012   Procedure: EXCISION MASS;  Surgeon: Shelly Rubenstein, MD;  Location: [facility];  Service: General;  Laterality: Right;  excision cyst right upper back  PLANTAR FASCIA RELEASE  04/12/2007   left heel   THYROIDECTOMY     TONSILLECTOMY      FAMILY HISTORY: Family History  Problem Relation Age of Onset   Cancer Mother        breast   Cancer Maternal Grandmother        breast    SOCIAL HISTORY: Social History   Socioeconomic History   Marital status: Married    Spouse name: Not on file   Number of children: Not on file   Years of education: Not on file   Highest education level: Not on file  Occupational History   Not on file  Tobacco Use   Smoking status: Never   Smokeless tobacco: Never  Vaping Use   Vaping status: Never Used  Substance and Sexual Activity   Alcohol use: Yes    Alcohol/week: 1.0  standard drink of alcohol    Types: 1 Glasses of wine per week    Comment: 1 glass of wine per night   Drug use: No   Sexual activity: Never  Other Topics Concern   Not on file  Social History Narrative   Not on file   Social Determinants of Health   Financial Resource Strain: Not on file  Food Insecurity: Not on file  Transportation Needs: Not on file  Physical Activity: Not on file  Stress: Not on file  Social Connections: Unknown (08/24/2021)   Received from [facility], [facility]   Social Network    Social Network: Not on file  Intimate Partner Violence: Unknown (07/16/2021)   Received from [facility], [facility]   HITS    Physically Hurt: Not on file    Insult or Talk Down To: Not on file    Threaten Physical Harm: Not on file    Scream or Curse: Not on file   PHYSICAL EXAM  Vitals:   02/28/23 0836  BP: (!) 140/60  Pulse: 66  Weight: 154 lb (69.9 kg)  Height: 5\' 3"  (1.6 m)   Body mass index is 27.28 kg/m.  Generalized: Well developed, in no acute distress. Looks really well, well depressed and appearing.  Neurological examination  Mentation: Alert oriented to time, place, history taking. Follows all commands speech and language fluent Cranial nerve II-XII: Pupils were equal round reactive to light. Extraocular movements were full, visual field were full on confrontational test. Facial sensation and strength were normal. Head turning and shoulder shrug  were normal and symmetric. Motor: The motor testing reveals 5 over 5 strength of all 4 extremities. Good symmetric motor tone is noted throughout.  Sensory: Sensory testing is intact to soft touch on all 4 extremities. No evidence of extinction is noted.  Coordination: Cerebellar testing reveals good finger-nose-finger and heel-to-shin bilaterally.  Gait and station: Gait is normal.  Reflexes: Deep tendon reflexes are symmetric but increased throughout   DIAGNOSTIC DATA (LABS, IMAGING, TESTING) - I  reviewed patient records, labs, notes, testing and imaging myself where available.  Lab Results  Component Value Date   WBC 9.3 06/16/2013   HGB 13.3 06/16/2013   HCT 39.9 06/16/2013   MCV 91.9 06/16/2013   PLT 227 06/16/2013      Component Value Date/Time   NA 143 06/16/2013 1815   K 3.6 (L) 06/16/2013 1815   CL 103 06/16/2013 1815   CO2 24 06/16/2013 1815   GLUCOSE 109 (H) 06/16/2013 1815   BUN 19 06/16/2013 1815   CREATININE 0.80 06/16/2013 1815  CALCIUM 9.0 06/16/2013 1815   PROT 6.7 06/16/2013 1815   ALBUMIN 3.6 06/16/2013 1815   AST 26 06/16/2013 1815   ALT 21 06/16/2013 1815   ALKPHOS 89 06/16/2013 1815   BILITOT 0.5 06/16/2013 1815   GFRNONAA 72 (L) 06/16/2013 1815   GFRAA 83 (L) 06/16/2013 1815   Lab Results  Component Value Date   CHOL  12/25/2008    188        ATP III CLASSIFICATION:  <200     mg/dL   Desirable  604-540  mg/dL   Borderline High  >=981    mg/dL   High          HDL 49 12/25/2008   LDLCALC (H) 12/25/2008    116        Total Cholesterol/HDL:CHD Risk Coronary Heart Disease Risk Table                     Men   Women  1/2 Average Risk   3.4   3.3  Average Risk       5.0   4.4  2 X Average Risk   9.6   7.1  3 X Average Risk  23.4   11.0        Use the calculated Patient Ratio above and the CHD Risk Table to determine the patient's CHD Risk.        ATP III CLASSIFICATION (LDL):  <100     mg/dL   Optimal  191-478  mg/dL   Near or Above                    Optimal  130-159  mg/dL   Borderline  295-621  mg/dL   High  >308     mg/dL   Very High   TRIG 657 12/25/2008   CHOLHDL 3.8 12/25/2008   No results found for: "HGBA1C" No results found for: "VITAMINB12"  Margie Ege, AGNP-C, DNP 02/28/2023, 9:05 AM [facility] [address] [place], [place] [address] [phone]

## 2023-02-28 ENCOUNTER — Ambulatory Visit: Payer: Medicare Other | Admitting: Neurology

## 2023-02-28 ENCOUNTER — Telehealth: Payer: Self-pay | Admitting: Neurology

## 2023-02-28 VITALS — BP 140/60 | HR 66 | Ht 63.0 in | Wt 154.0 lb

## 2023-02-28 DIAGNOSIS — M4802 Spinal stenosis, cervical region: Secondary | ICD-10-CM

## 2023-02-28 DIAGNOSIS — Q7649 Other congenital malformations of spine, not associated with scoliosis: Secondary | ICD-10-CM | POA: Diagnosis not present

## 2023-02-28 DIAGNOSIS — R202 Paresthesia of skin: Secondary | ICD-10-CM

## 2023-02-28 DIAGNOSIS — M542 Cervicalgia: Secondary | ICD-10-CM

## 2023-02-28 MED ORDER — GABAPENTIN 100 MG PO CAPS: ORAL_CAPSULE | ORAL | 3 refills | Status: AC

## 2023-02-28 MED ORDER — DULOXETINE HCL 60 MG PO CPEP: 60.0000 mg | ORAL_CAPSULE | Freq: Every day | ORAL | 3 refills | Status: AC

## 2023-02-28 NOTE — Telephone Encounter (Signed)
Referral for neurosurgery fax to [facility]. Phone: [phone], Fax: [phone] 

## 2023-02-28 NOTE — Patient Instructions (Signed)
I will refer you to see Dr. Jake Samples for cervical spine stenosis to get his opinion about management going forward  We will continue current medications Keep me posted on neurosurgery visit Follow up in 6-8 months

## 2023-03-01 NOTE — Progress Notes (Signed)
Chart reviewed, agree above plan ?

## 2023-03-28 NOTE — Telephone Encounter (Signed)
Pt states [facility] doesn't have her information. Advised they're having issues with their fax machine. Reached out to referral coordinator and it will be refaxed

## 2023-03-28 NOTE — Telephone Encounter (Signed)
Referral for neurosurgery refax to [facility]. Phone: [phone], Fax: [phone]

## 2023-04-19 DIAGNOSIS — M4802 Spinal stenosis, cervical region: Secondary | ICD-10-CM | POA: Diagnosis not present

## 2023-04-19 DIAGNOSIS — M542 Cervicalgia: Secondary | ICD-10-CM | POA: Diagnosis not present

## 2023-04-19 DIAGNOSIS — Z6828 Body mass index (BMI) 28.0-28.9, adult: Secondary | ICD-10-CM | POA: Diagnosis not present

## 2023-05-03 DIAGNOSIS — M4802 Spinal stenosis, cervical region: Secondary | ICD-10-CM | POA: Diagnosis not present

## 2023-05-03 DIAGNOSIS — M542 Cervicalgia: Secondary | ICD-10-CM | POA: Diagnosis not present

## 2023-05-09 DIAGNOSIS — M4802 Spinal stenosis, cervical region: Secondary | ICD-10-CM | POA: Diagnosis not present

## 2023-05-09 DIAGNOSIS — M542 Cervicalgia: Secondary | ICD-10-CM | POA: Diagnosis not present

## 2023-05-12 DIAGNOSIS — M4802 Spinal stenosis, cervical region: Secondary | ICD-10-CM | POA: Diagnosis not present

## 2023-05-12 DIAGNOSIS — M542 Cervicalgia: Secondary | ICD-10-CM | POA: Diagnosis not present

## 2023-05-16 DIAGNOSIS — M542 Cervicalgia: Secondary | ICD-10-CM | POA: Diagnosis not present

## 2023-05-16 DIAGNOSIS — M4802 Spinal stenosis, cervical region: Secondary | ICD-10-CM | POA: Diagnosis not present

## 2023-05-19 ENCOUNTER — Other Ambulatory Visit: Payer: Self-pay

## 2023-05-19 DIAGNOSIS — I739 Peripheral vascular disease, unspecified: Secondary | ICD-10-CM

## 2023-06-05 NOTE — Progress Notes (Unsigned)
 Patient name: Lori Webb MRN: [id number] DOB: 1940/06/07 Sex: female  REASON FOR CONSULT: Claudication  HPI: Lori Webb is a 83 y.o. female, with history of arthritis that presents for evaluation of lower extremity claudication.  Past Medical History:  Diagnosis Date   Anxiety    Arthritis    hands   Cholelithiasis with obstruction 12/12/2012   Diverticulosis    Hx of diverticulitis of colon 12/12/2012   11/21/12 first diagnosis of this.   Hypothyroid 12/12/2012   S/p thyroidectomy for Goiter.    Past Surgical History:  Procedure Laterality Date   ABDOMINAL HYSTERECTOMY     APPENDECTOMY     CESAREAN SECTION     x2   CHOLECYSTECTOMY N/A 12/14/2012   Procedure: LAPAROSCOPIC CHOLECYSTECTOMY WITH INTRAOPERATIVE CHOLANGIOGRAM;  Surgeon: Adolph Pollack, MD;  Location: [facility];  Service: General;  Laterality: N/A;   COLONOSCOPY     CYST EXCISION N/A 10/14/2021   Procedure: EXCISION RIGHT UPPER BACK CYST;  Surgeon: Abigail Miyamoto, MD;  Location: [facility];  Service: General;  Laterality: N/A;   DILATION AND CURETTAGE OF UTERUS     ERCP N/A 12/13/2012   Procedure: ENDOSCOPIC RETROGRADE CHOLANGIOPANCREATOGRAPHY (ERCP);  Surgeon: Iva Boop, MD;  Location: [facility];  Service: Endoscopy;  Laterality: N/A;   ERCP N/A 12/16/2012   Procedure: ENDOSCOPIC RETROGRADE CHOLANGIOPANCREATOGRAPHY (ERCP);  Surgeon: Iva Boop, MD;  Location: [facility];  Service: Gastroenterology;  Laterality: N/A;   FRACTURE SURGERY     nasal fx  x3   65 years ago   MASS EXCISION  05/02/2012   Procedure: EXCISION MASS;  Surgeon: Shelly Rubenstein, MD;  Location: [facility];  Service: General;  Laterality: Right;  excision cyst right upper back   PLANTAR FASCIA RELEASE  04/12/2007   left heel   THYROIDECTOMY     TONSILLECTOMY      Family History  Problem Relation Age of Onset   Cancer Mother        breast   Cancer Maternal Grandmother        breast    SOCIAL  HISTORY: Social History   Socioeconomic History   Marital status: Married    Spouse name: Not on file   Number of children: Not on file   Years of education: Not on file   Highest education level: Not on file  Occupational History   Not on file  Tobacco Use   Smoking status: Never   Smokeless tobacco: Never  Vaping Use   Vaping status: Never Used  Substance and Sexual Activity   Alcohol use: Yes    Alcohol/week: 1.0 standard drink of alcohol    Types: 1 Glasses of wine per week    Comment: 1 glass of wine per night   Drug use: No   Sexual activity: Never  Other Topics Concern   Not on file  Social History Narrative   Not on file   Social Drivers of Health   Financial Resource Strain: Not on file  Food Insecurity: Not on file  Transportation Needs: Not on file  Physical Activity: Not on file  Stress: Not on file  Social Connections: Unknown (08/24/2021)   Received from [facility], [facility]   Social Network    Social Network: Not on file  Intimate Partner Violence: Unknown (07/16/2021)   Received from [facility], [facility]   HITS    Physically Hurt: Not on file    Insult or Talk Down  To: Not on file    Threaten Physical Harm: Not on file    Scream or Curse: Not on file    Allergies  Allergen Reactions   Crestor [Rosuvastatin]     Muscle pain    Current Outpatient Medications  Medication Sig Dispense Refill   acetaminophen (TYLENOL) 650 MG CR tablet Take 650 mg by mouth every 8 (eight) hours as needed for pain.     CALCIUM PO Take by mouth daily.     Cholecalciferol (VITAMIN D3 PO) Take 1,200 Units by mouth daily.     DULoxetine (CYMBALTA) 60 MG capsule Take 1 capsule (60 mg total) by mouth daily. Fill her Cymbalta 30mg  daily first. 90 capsule 3   gabapentin (NEURONTIN) 100 MG capsule Take 1 capsule twice daily, take 3 at night 450 capsule 3   levothyroxine (SYNTHROID, LEVOTHROID) 100 MCG tablet Take 100 mcg by mouth daily before breakfast.       traMADol (ULTRAM) 50 MG tablet Take 1 tablet (50 mg total) by mouth every 6 (six) hours as needed for moderate pain or severe pain. 20 tablet 0   No current facility-administered medications for this visit.    REVIEW OF SYSTEMS:  [X]  denotes positive finding, [ ]  denotes negative finding Cardiac  Comments:  Chest pain or chest pressure: ***   Shortness of breath upon exertion:    Short of breath when lying flat:    Irregular heart rhythm:        Vascular    Pain in calf, thigh, or hip brought on by ambulation:    Pain in feet at night that wakes you up from your sleep:     Blood clot in your veins:    Leg swelling:         Pulmonary    Oxygen at home:    Productive cough:     Wheezing:         Neurologic    Sudden weakness in arms or legs:     Sudden numbness in arms or legs:     Sudden onset of difficulty speaking or slurred speech:    Temporary loss of vision in one eye:     Problems with dizziness:         Gastrointestinal    Blood in stool:     Vomited blood:         Genitourinary    Burning when urinating:     Blood in urine:        Psychiatric    Major depression:         Hematologic    Bleeding problems:    Problems with blood clotting too easily:        Skin    Rashes or ulcers:        Constitutional    Fever or chills:      PHYSICAL EXAM: There were no vitals filed for this visit.  GENERAL: The patient is a well-nourished female, in no acute distress. The vital signs are documented above. CARDIAC: There is a regular rate and rhythm.  VASCULAR: *** PULMONARY: There is good air exchange bilaterally without wheezing or rales. ABDOMEN: Soft and non-tender with normal pitched bowel sounds.  MUSCULOSKELETAL: There are no major deformities or cyanosis. NEUROLOGIC: No focal weakness or paresthesias are detected. SKIN: There are no ulcers or rashes noted. PSYCHIATRIC: The patient has a normal affect.  DATA:   ABIs  Assessment/Plan:  83 y.o.  female, with history of arthritis that  presents for evaluation of lower extremity claudication.   Cephus Shelling, MD [facility] Office: [phone]

## 2023-06-06 ENCOUNTER — Encounter: Payer: Self-pay | Admitting: Vascular Surgery

## 2023-06-06 ENCOUNTER — Ambulatory Visit (HOSPITAL_COMMUNITY)
Admission: RE | Admit: 2023-06-06 | Discharge: 2023-06-06 | Disposition: A | Discharge status: Home / Self Care | Payer: Medicare Other | Source: Ambulatory Visit | Attending: Vascular Surgery | Admitting: Vascular Surgery

## 2023-06-06 ENCOUNTER — Ambulatory Visit: Payer: Medicare Other | Admitting: Vascular Surgery

## 2023-06-06 VITALS — BP 135/60 | HR 57 | Temp 98.0°F | Resp 18 | Ht 63.0 in | Wt 161.1 lb

## 2023-06-06 DIAGNOSIS — R202 Paresthesia of skin: Secondary | ICD-10-CM | POA: Diagnosis not present

## 2023-06-06 DIAGNOSIS — I739 Peripheral vascular disease, unspecified: Secondary | ICD-10-CM | POA: Diagnosis not present

## 2023-06-06 LAB — VAS US ABI WITH/WO TBI
Left ABI: 1.15
Right ABI: 1.12

## 2023-07-13 DIAGNOSIS — L821 Other seborrheic keratosis: Secondary | ICD-10-CM | POA: Diagnosis not present

## 2023-07-13 DIAGNOSIS — L820 Inflamed seborrheic keratosis: Secondary | ICD-10-CM | POA: Diagnosis not present

## 2023-07-13 DIAGNOSIS — L538 Other specified erythematous conditions: Secondary | ICD-10-CM | POA: Diagnosis not present

## 2023-07-13 DIAGNOSIS — L814 Other melanin hyperpigmentation: Secondary | ICD-10-CM | POA: Diagnosis not present

## 2023-07-13 DIAGNOSIS — L2989 Other pruritus: Secondary | ICD-10-CM | POA: Diagnosis not present

## 2023-07-13 DIAGNOSIS — Z789 Other specified health status: Secondary | ICD-10-CM | POA: Diagnosis not present

## 2023-07-13 DIAGNOSIS — D2339 Other benign neoplasm of skin of other parts of face: Secondary | ICD-10-CM | POA: Diagnosis not present

## 2023-07-24 DIAGNOSIS — K080 Exfoliation of teeth due to systemic causes: Secondary | ICD-10-CM | POA: Diagnosis not present

## 2023-08-09 DIAGNOSIS — H3552 Pigmentary retinal dystrophy: Secondary | ICD-10-CM | POA: Diagnosis not present

## 2023-08-09 DIAGNOSIS — H40013 Open angle with borderline findings, low risk, bilateral: Secondary | ICD-10-CM | POA: Diagnosis not present

## 2023-08-09 DIAGNOSIS — H524 Presbyopia: Secondary | ICD-10-CM | POA: Diagnosis not present

## 2023-08-17 DIAGNOSIS — H25813 Combined forms of age-related cataract, bilateral: Secondary | ICD-10-CM | POA: Diagnosis not present

## 2023-08-22 DIAGNOSIS — K080 Exfoliation of teeth due to systemic causes: Secondary | ICD-10-CM | POA: Diagnosis not present

## 2023-09-01 DIAGNOSIS — E039 Hypothyroidism, unspecified: Secondary | ICD-10-CM | POA: Diagnosis not present

## 2023-09-01 DIAGNOSIS — H25812 Combined forms of age-related cataract, left eye: Secondary | ICD-10-CM | POA: Diagnosis not present

## 2023-09-01 DIAGNOSIS — H2512 Age-related nuclear cataract, left eye: Secondary | ICD-10-CM | POA: Diagnosis not present

## 2023-09-15 DIAGNOSIS — H2511 Age-related nuclear cataract, right eye: Secondary | ICD-10-CM | POA: Diagnosis not present

## 2023-09-15 DIAGNOSIS — E039 Hypothyroidism, unspecified: Secondary | ICD-10-CM | POA: Diagnosis not present

## 2023-09-15 DIAGNOSIS — H25811 Combined forms of age-related cataract, right eye: Secondary | ICD-10-CM | POA: Diagnosis not present

## 2023-09-28 ENCOUNTER — Ambulatory Visit: Payer: Medicare Other | Admitting: Neurology

## 2023-10-05 DIAGNOSIS — D692 Other nonthrombocytopenic purpura: Secondary | ICD-10-CM | POA: Diagnosis not present

## 2023-12-29 DIAGNOSIS — H9191 Unspecified hearing loss, right ear: Secondary | ICD-10-CM | POA: Diagnosis not present

## 2023-12-29 DIAGNOSIS — H6121 Impacted cerumen, right ear: Secondary | ICD-10-CM | POA: Diagnosis not present

## 2024-01-22 DIAGNOSIS — Z0000 Encounter for general adult medical examination without abnormal findings: Secondary | ICD-10-CM | POA: Diagnosis not present

## 2024-01-22 DIAGNOSIS — E039 Hypothyroidism, unspecified: Secondary | ICD-10-CM | POA: Diagnosis not present

## 2024-01-22 DIAGNOSIS — M792 Neuralgia and neuritis, unspecified: Secondary | ICD-10-CM | POA: Diagnosis not present

## 2024-01-22 DIAGNOSIS — M8588 Other specified disorders of bone density and structure, other site: Secondary | ICD-10-CM | POA: Diagnosis not present

## 2024-01-22 DIAGNOSIS — Z780 Asymptomatic menopausal state: Secondary | ICD-10-CM | POA: Diagnosis not present

## 2024-02-19 DIAGNOSIS — M85852 Other specified disorders of bone density and structure, left thigh: Secondary | ICD-10-CM | POA: Diagnosis not present

## 2024-02-19 DIAGNOSIS — M85851 Other specified disorders of bone density and structure, right thigh: Secondary | ICD-10-CM | POA: Diagnosis not present

## 2024-02-19 DIAGNOSIS — Z1231 Encounter for screening mammogram for malignant neoplasm of breast: Secondary | ICD-10-CM | POA: Diagnosis not present

## 2024-02-23 DIAGNOSIS — M159 Polyosteoarthritis, unspecified: Secondary | ICD-10-CM | POA: Diagnosis not present

## 2024-02-23 DIAGNOSIS — M62838 Other muscle spasm: Secondary | ICD-10-CM | POA: Diagnosis not present

## 2024-02-23 DIAGNOSIS — B001 Herpesviral vesicular dermatitis: Secondary | ICD-10-CM | POA: Diagnosis not present

## 2024-03-04 ENCOUNTER — Telehealth: Payer: Self-pay | Admitting: Neurology

## 2024-03-04 NOTE — Telephone Encounter (Signed)
 I called pt.  It has been a year since she was last seen.  The gabapentin  she is taking for the last 2 yrs is not working.  She would like to change to something else.  She was did not really mention about hair loss to me.  She was agitated.  Felt like her neuropathy is worse and up to her thigh areas.  I told will place on waitlist. And let Dr. Onita know if she would consider another medication prior to 05/2024 appt. I did state that she may want to see her prior to any medicine change.  She did not want a NP visit.

## 2024-03-04 NOTE — Telephone Encounter (Signed)
 Pt is asking for a call to discuss that her gabapentin  (NEURONTIN ) 100 MG capsule  is not helping, it is causing her hair to fall out.  Pt wants a call to discuss trying anything else, please call pt. Based on nature of this call it has been routed to POD 2

## 2024-03-25 ENCOUNTER — Other Ambulatory Visit: Payer: Self-pay

## 2024-03-25 ENCOUNTER — Encounter (HOSPITAL_COMMUNITY): Payer: Self-pay

## 2024-03-25 ENCOUNTER — Emergency Department (HOSPITAL_COMMUNITY)

## 2024-03-25 ENCOUNTER — Emergency Department (HOSPITAL_COMMUNITY)
Admission: EM | Admit: 2024-03-25 | Discharge: 2024-03-25 | Disposition: A | Discharge status: Home / Self Care | Attending: Emergency Medicine | Admitting: Emergency Medicine

## 2024-03-25 DIAGNOSIS — T148XXA Other injury of unspecified body region, initial encounter: Secondary | ICD-10-CM

## 2024-03-25 DIAGNOSIS — M545 Low back pain, unspecified: Secondary | ICD-10-CM

## 2024-03-25 DIAGNOSIS — X58XXXA Exposure to other specified factors, initial encounter: Secondary | ICD-10-CM | POA: Diagnosis not present

## 2024-03-25 DIAGNOSIS — S39012A Strain of muscle, fascia and tendon of lower back, initial encounter: Secondary | ICD-10-CM | POA: Diagnosis not present

## 2024-03-25 DIAGNOSIS — Z79899 Other long term (current) drug therapy: Secondary | ICD-10-CM | POA: Diagnosis not present

## 2024-03-25 DIAGNOSIS — S3992XA Unspecified injury of lower back, initial encounter: Secondary | ICD-10-CM | POA: Diagnosis present

## 2024-03-25 DIAGNOSIS — E039 Hypothyroidism, unspecified: Secondary | ICD-10-CM | POA: Diagnosis not present

## 2024-03-25 LAB — COMPREHENSIVE METABOLIC PANEL WITH GFR
ALT: 23 U/L (ref 0–44)
AST: 27 U/L (ref 15–41)
Albumin: 4.1 g/dL (ref 3.5–5.0)
Alkaline Phosphatase: 128 U/L — ABNORMAL HIGH (ref 38–126)
Anion gap: 8 (ref 5–15)
BUN: 21 mg/dL (ref 8–23)
CO2: 27 mmol/L (ref 22–32)
Calcium: 9.3 mg/dL (ref 8.9–10.3)
Chloride: 106 mmol/L (ref 98–111)
Creatinine, Ser: 0.72 mg/dL (ref 0.44–1.00)
GFR, Estimated: >60 mL/min (ref >60)
Glucose, Bld: 108 mg/dL — ABNORMAL HIGH (ref 70–99)
Potassium: 4.1 mmol/L (ref 3.5–5.1)
Sodium: 141 mmol/L (ref 135–145)
Total Bilirubin: 0.5 mg/dL (ref 0.0–1.2)
Total Protein: 6.6 g/dL (ref 6.5–8.1)

## 2024-03-25 LAB — URINALYSIS, ROUTINE W REFLEX MICROSCOPIC
Bilirubin Urine: NEGATIVE
Glucose, UA: NEGATIVE mg/dL
Hgb urine dipstick: NEGATIVE
Ketones, ur: NEGATIVE mg/dL
Leukocytes,Ua: NEGATIVE
Nitrite: NEGATIVE
Protein, ur: NEGATIVE mg/dL
Specific Gravity, Urine: 1.04 — ABNORMAL HIGH (ref 1.005–1.030)
pH: 7 (ref 5.0–8.0)

## 2024-03-25 LAB — I-STAT CHEM 8, ED
BUN: 23 mg/dL (ref 8–23)
Calcium, Ion: 1.21 mmol/L (ref 1.15–1.40)
Chloride: 104 mmol/L (ref 98–111)
Creatinine, Ser: 0.8 mg/dL (ref 0.44–1.00)
Glucose, Bld: 106 mg/dL — ABNORMAL HIGH (ref 70–99)
HCT: 42 % (ref 36.0–46.0)
Hemoglobin: 14.3 g/dL (ref 12.0–15.0)
Potassium: 4 mmol/L (ref 3.5–5.1)
Sodium: 142 mmol/L (ref 135–145)
TCO2: 25 mmol/L (ref 22–32)

## 2024-03-25 LAB — CBC WITH DIFFERENTIAL/PLATELET
Abs Immature Granulocytes: 0.01 K/uL (ref 0.00–0.07)
Basophils Absolute: 0.1 K/uL (ref 0.0–0.1)
Basophils Relative: 1 %
Eosinophils Absolute: 0.1 K/uL (ref 0.0–0.5)
Eosinophils Relative: 1 %
HCT: 42.8 % (ref 36.0–46.0)
Hemoglobin: 13.8 g/dL (ref 12.0–15.0)
Immature Granulocytes: 0 %
Lymphocytes Relative: 30 %
Lymphs Abs: 2.1 K/uL (ref 0.7–4.0)
MCH: 31.2 pg (ref 26.0–34.0)
MCHC: 32.2 g/dL (ref 30.0–36.0)
MCV: 96.6 fL (ref 80.0–100.0)
Monocytes Absolute: 0.5 K/uL (ref 0.1–1.0)
Monocytes Relative: 7 %
Neutro Abs: 4.1 K/uL (ref 1.7–7.7)
Neutrophils Relative %: 61 %
Platelets: 260 K/uL (ref 150–400)
RBC: 4.43 MIL/uL (ref 3.87–5.11)
RDW: 13.5 % (ref 11.5–15.5)
WBC: 6.9 K/uL (ref 4.0–10.5)
nRBC: 0 % (ref 0.0–0.2)

## 2024-03-25 LAB — LIPASE, BLOOD: Lipase: 31 U/L (ref 11–51)

## 2024-03-25 MED ORDER — IOHEXOL 300 MG/ML  SOLN
100.0000 mL | Freq: Once | INTRAMUSCULAR | Status: AC | PRN
  Administered 2024-03-25: 11:00:00 100 mL via INTRAVENOUS

## 2024-03-25 MED ORDER — LIDOCAINE 5 % EX PTCH
1.0000 | MEDICATED_PATCH | CUTANEOUS | Status: DC
  Administered 2024-03-25: 14:00:00 1 via TRANSDERMAL
  Filled 2024-03-25: qty 1

## 2024-03-25 MED ORDER — MORPHINE SULFATE (PF) 4 MG/ML IV SOLN
4.0000 mg | Freq: Once | INTRAVENOUS | Status: AC
  Administered 2024-03-25: 11:00:00 4 mg via INTRAVENOUS
  Filled 2024-03-25: qty 1

## 2024-03-25 MED ORDER — SODIUM CHLORIDE (PF) 0.9 % IJ SOLN
INTRAMUSCULAR | Status: AC
  Filled 2024-03-25: qty 50

## 2024-03-25 MED ORDER — ACETAMINOPHEN 325 MG PO TABS
650.0000 mg | ORAL_TABLET | Freq: Once | ORAL | Status: AC
  Administered 2024-03-25: 14:00:00 650 mg via ORAL
  Filled 2024-03-25: qty 2

## 2024-03-25 MED ORDER — ONDANSETRON HCL 4 MG/2ML IJ SOLN
4.0000 mg | Freq: Once | INTRAMUSCULAR | Status: AC
  Administered 2024-03-25: 11:00:00 4 mg via INTRAVENOUS
  Filled 2024-03-25: qty 2

## 2024-03-25 MED ORDER — LIDOCAINE 5 % EX PTCH: 1.0000 | MEDICATED_PATCH | CUTANEOUS | 0 refills | Status: AC

## 2024-03-25 NOTE — Discharge Instructions (Addendum)
 Please follow-up with primary care provider in the next 48 to 72 hours.  Seek emergency care if experiencing any new or worsening symptoms.  Alternating between 650 mg Tylenol  and 400 mg Advil: The best way to alternate taking Acetaminophen  (example Tylenol ) and Ibuprofen (example Advil/Motrin) is to take them 3 hours apart. For example, if you take ibuprofen at 6 am you can then take Tylenol  at 9 am. You can continue this regimen throughout the day, making sure you do not exceed the recommended maximum dose for each drug.  Do not take Advil and Meloxicam at the same time.  CT findings: IMPRESSION: 1. No acute abnormality. 2. Sigmoid diverticulosis. 3. Diffuse hepatic steatosis.

## 2024-03-25 NOTE — ED Provider Notes (Signed)
 [place] [facility] AT [facility] Provider Note   CSN: [id number] Arrival date & time: 03/25/24  9144     Patient presents with: Back Pain   Lori Webb is a 83 y.o. female with PMHx GERD, hypothyroidism, low back pain, who presents to ED concerned for lumbar back pain that started yesterday after driving from [employer], [place]. Patient stating that she has not been urinating a lot recently - but also states that she is not drinking water like she is supposed to. Patient was able to urinate well earlier this morning. Denies radiation to BL LE. Patient has not taken any medications for their pain today.  Patient concerned that her kidneys might be the cause of her pain.  Denies fever, chest pain, SOB, nausea, vomiting, diarrhea, vaginal discharge/bleeding, dysuria, hematuria. Patient denies urinary retention, fecal incontinence, saddle anesthesia, lower extremity weakness, hx of cancer, fever, immunosuppression, IVDU, recent spinal procedure, significant trauma    Back Pain      Prior to Admission medications  Medication Sig Start Date End Date Taking? Authorizing Provider  lidocaine  (LIDODERM ) 5 % Place 1 patch onto the skin daily. Remove & Discard patch within 12 hours or as directed by MD 03/25/24  Yes Hoy Nidia FALCON, PA-C  acetaminophen  (TYLENOL ) 650 MG CR tablet Take 650 mg by mouth every 8 (eight) hours as needed for pain.    [provider]  CALCIUM PO Take by mouth daily.    [provider]  Cholecalciferol (VITAMIN D3 PO) Take 1,200 Units by mouth daily.    [provider]  DULoxetine  (CYMBALTA ) 60 MG capsule Take 1 capsule (60 mg total) by mouth daily. Fill her Cymbalta  30mg  daily first. 02/28/23   Gayland Lauraine PARAS, NP  gabapentin  (NEURONTIN ) 100 MG capsule Take 1 capsule twice daily, take 3 at night 02/28/23   Gayland Lauraine PARAS, NP  levothyroxine  (SYNTHROID , LEVOTHROID) 100 MCG tablet Take 100 mcg by mouth daily before  breakfast.     [provider]  traMADol  (ULTRAM ) 50 MG tablet Take 1 tablet (50 mg total) by mouth every 6 (six) hours as needed for moderate pain or severe pain. 10/14/21   Vernetta Berg, MD    Allergies: Patient has no active allergies.    Review of Systems  Musculoskeletal:  Positive for back pain.    Updated Vital Signs BP (!) 157/51 (BP Location: Left Arm)   Pulse (!) 50   Temp 97.8 F (36.6 C) (Oral)   Resp 17   Ht 5' 3 (1.6 m)   Wt 71.2 kg   SpO2 100%   BMI 27.81 kg/m   Physical Exam Vitals and nursing note reviewed.  Constitutional:      General: She is not in acute distress.    Appearance: She is not ill-appearing or toxic-appearing.  HENT:     Head: Normocephalic and atraumatic.     Mouth/Throat:     Mouth: Mucous membranes are moist.  Eyes:     General: No scleral icterus.       Right eye: No discharge.        Left eye: No discharge.     Conjunctiva/sclera: Conjunctivae normal.  Cardiovascular:     Rate and Rhythm: Normal rate and regular rhythm.     Pulses: Normal pulses.     Heart sounds: Normal heart sounds. No murmur heard. Pulmonary:     Effort: Pulmonary effort is normal. No respiratory distress.     Breath sounds: Normal breath  sounds. No wheezing, rhonchi or rales.  Abdominal:     General: Abdomen is flat. Bowel sounds are normal. There is no distension.     Palpations: Abdomen is soft. There is no mass.     Tenderness: There is no abdominal tenderness.  Musculoskeletal:     Right lower leg: No edema.     Left lower leg: No edema.     Comments: BL lumbar paraspinal muscles are tender to palpation. No midline tenderness, step-off, crepitus, erythema, or swelling.   Skin:    General: Skin is warm and dry.     Findings: No rash.  Neurological:     General: No focal deficit present.     Mental Status: She is alert. Mental status is at baseline.     Comments: Sensation to light touch intact in BL LE.  Psychiatric:        Mood and  Affect: Mood normal.     (all labs ordered are listed, but only abnormal results are displayed) Labs Reviewed  COMPREHENSIVE METABOLIC PANEL WITH GFR - Abnormal; Notable for the following components:      Result Value   Glucose, Bld 108 (*)    Alkaline Phosphatase 128 (*)    All other components within normal limits  URINALYSIS, ROUTINE W REFLEX MICROSCOPIC - Abnormal; Notable for the following components:   Specific Gravity, Urine 1.040 (*)    All other components within normal limits  I-STAT CHEM 8, ED - Abnormal; Notable for the following components:   Glucose, Bld 106 (*)    All other components within normal limits  LIPASE, BLOOD  CBC WITH DIFFERENTIAL/PLATELET    EKG: None  Radiology: CT ABDOMEN PELVIS W CONTRAST Result Date: 03/25/2024 CLINICAL DATA:  Acute, non localized abdominal pain. EXAM: CT ABDOMEN AND PELVIS WITH CONTRAST TECHNIQUE: Multidetector CT imaging of the abdomen and pelvis was performed using the standard protocol following bolus administration of intravenous contrast. RADIATION DOSE REDUCTION: This exam was performed according to the departmental dose-optimization program which includes automated exposure control, adjustment of the mA and/or kV according to patient size and/or use of iterative reconstruction technique. CONTRAST:  OMNIPAQUE  IOHEXOL  300 MG/ML  SOLN COMPARISON:  12/21/2017. FINDINGS: Lower chest: Stable mild left basilar scarring. Hepatobiliary: Diffuse low-density of the liver. Cholecystectomy clips. Pancreas: Unremarkable. No pancreatic ductal dilatation or surrounding inflammatory changes. Spleen: Normal in size without focal abnormality. Adrenals/Urinary Tract: Normal-appearing adrenal glands. Left cortical and bilateral parapelvic simple appearing cysts, not requiring imaging follow-up. Unremarkable ureters and urinary bladder. Stomach/Bowel: Multiple sigmoid colon diverticula without evidence of diverticulitis. Surgically absent appendix.  Unremarkable stomach and small bowel. Vascular/Lymphatic: Mild atheromatous calcifications without aneurysm. No enlarged lymph nodes. Reproductive: Status post hysterectomy. No adnexal masses. Other: Minimal umbilical hernia containing fat. Musculoskeletal: Lumbar and lower thoracic spine degenerative changes. IMPRESSION: 1. No acute abnormality. 2. Sigmoid diverticulosis. 3. Diffuse hepatic steatosis. Electronically Signed   By: Elspeth Bathe M.D.   On: 03/25/2024 11:59     Procedures   Medications Ordered in the ED  acetaminophen  (TYLENOL ) tablet 650 mg (has no administration in time range)  lidocaine  (LIDODERM ) 5 % 1 patch (has no administration in time range)  morphine  (PF) 4 MG/ML injection 4 mg (4 mg Intravenous Given 03/25/24 1048)  ondansetron  (ZOFRAN ) injection 4 mg (4 mg Intravenous Given 03/25/24 1047)  iohexol  (OMNIPAQUE ) 300 MG/ML solution 100 mL (100 mLs Intravenous Contrast Given 03/25/24 1117)  Medical Decision Making Amount and/or Complexity of Data Reviewed Labs: ordered. Radiology: ordered.  Risk OTC drugs. Prescription drug management.   This patient presents to the ED for concern of back pain, this involves an extensive number of treatment options, and is a complaint that carries with it a high risk of complications and morbidity.  The differential diagnosis includes pyelonephritis, nephrolithiasis, spinal abscess, osteomyelitis, herniated disc, muscle strain, spinal fracture, meningitis, cancer, cauda equina syndrome.   Co morbidities that complicate the patient evaluation  GERD, hypothyroidism, low back pain   Additional history obtained:  Dr. Chet PCP    Problem List / ED Course / Critical interventions / Medication management  Patient presents ED concern for bilateral lower back pain since yesterday.  Physical exam with lumbar paraspinal muscle tenderness to palpation.  Rest of physical exam reassuring.  Patient  afebrile with stable vitals. I Ordered, and personally interpreted labs.  UA without concern for infection.  CBC without leukocytosis or anemia.  CMP with mild hyperglycemia at 190.  Lipase within normal limits. I ordered imaging studies including CT abdomen/pelvis. I independently visualized and interpreted imaging which showed no acute process. I agree with the radiologist interpretation Shared all results with patient.  Answered all questions. Patient able to ambulate. No physical exam findings/concerns for cauda equina symptoms at this time or other emergent process.  Recommended following up with PCP.  Patient agreeable to plan. Staffed with Dr. Doretha, I have reviewed the patients home medicines and have made adjustments as needed The patient has been appropriately medically screened and/or stabilized in the ED. I have low suspicion for any other emergent medical condition which would require further screening, evaluation or treatment in the ED or require inpatient management. At time of discharge the patient is hemodynamically stable and in no acute distress. I have discussed work-up results and diagnosis with patient and answered all questions. Patient is agreeable with discharge plan. We discussed strict return precautions for returning to the emergency department and they verbalized understanding.     Social Determinants of Health:  geriatric      Final diagnoses:  Acute bilateral low back pain without sciatica  Muscle strain    ED Discharge Orders          Ordered    lidocaine  (LIDODERM ) 5 %  Every 24 hours        03/25/24 1345               Hoy Nidia FALCON, [place] 03/25/24 1411    Doretha Folks, MD 03/25/24 1505

## 2024-03-25 NOTE — ED Triage Notes (Signed)
 Lower back pain that started yesterday, denies injury. No difficulty or pain with urination.

## 2024-05-21 ENCOUNTER — Ambulatory Visit: Admitting: Neurology
